# Patient Record
Sex: Female | Born: 1985 | Race: White | Hispanic: No | Marital: Married | Smoking: Never smoker
Health system: Southern US, Community
[De-identification: ages and names within clinical notes are randomized; demographics above are authoritative.]

## PROBLEM LIST (undated history)

## (undated) DIAGNOSIS — R41840 Attention and concentration deficit: Secondary | ICD-10-CM

## (undated) DIAGNOSIS — F419 Anxiety disorder, unspecified: Secondary | ICD-10-CM

## (undated) HISTORY — DX: Anxiety disorder, unspecified: F41.9

## (undated) HISTORY — PX: TONSILLECTOMY: SHX5217

---
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
## 2006-02-16 ENCOUNTER — Emergency Department: Payer: Self-pay | Admitting: Emergency Medicine

## 2007-03-24 ENCOUNTER — Inpatient Hospital Stay (HOSPITAL_COMMUNITY): Admission: AD | Admit: 2007-03-24 | Discharge: 2007-03-24 | Payer: Self-pay | Admitting: Obstetrics and Gynecology

## 2007-04-03 ENCOUNTER — Inpatient Hospital Stay (HOSPITAL_COMMUNITY): Admission: AD | Admit: 2007-04-03 | Discharge: 2007-04-03 | Payer: Self-pay | Admitting: Obstetrics & Gynecology

## 2007-04-04 ENCOUNTER — Inpatient Hospital Stay (HOSPITAL_COMMUNITY): Admission: AD | Admit: 2007-04-04 | Discharge: 2007-04-04 | Payer: Self-pay | Admitting: Obstetrics & Gynecology

## 2007-04-21 ENCOUNTER — Inpatient Hospital Stay (HOSPITAL_COMMUNITY): Admission: RE | Admit: 2007-04-21 | Discharge: 2007-04-23 | Payer: Self-pay | Admitting: Obstetrics and Gynecology

## 2009-12-15 ENCOUNTER — Inpatient Hospital Stay: Payer: Self-pay | Admitting: Psychiatry

## 2011-01-04 NOTE — Op Note (Signed)
[facility]  Patient:    Claudia Casey, Claudia Casey Visit Number: [id number] MRN: [id number]          Service Type: DSU Location: [facility] Attending Physician:  Corie Chiquito Dictated by:   Margit Banda. Jearld Fenton, M.D. Proc. Date: 11/19/01 Admit Date:  11/19/2001 Discharge Date: 11/20/2001   CC:         [facility]   Operative Report  PREOPERATIVE DIAGNOSIS:  Chronic tonsillitis.  POSTOPERATIVE DIAGNOSIS:  Chronic tonsillitis.  PROCEDURE:  Tonsillectomy.  ANESTHESIA:  General endotracheal tube.  ESTIMATED BLOOD LOSS:  Less than 5 cc.  INDICATION:  This is a 25 year old who has had chronic problems with her tonsils.  She has had repetitive infections that have caused significant missed school and discomfort from the issues.  She meets the medical criteria and is now ready to proceed with tonsillectomy.  She was informed of the risks and benefits of the procedure, including bleeding, infection, velopharyngeal insufficiency, change in the voice, chronic pain, and risk of the anesthetic. All questions are answered and consent was obtained.  DESCRIPTION OF PROCEDURE:  Patient taken to the operating room, placed in a supine position.  After adequate general endotracheal anesthesia, was placed in the Rose position, draped in the usual sterile manner.  A Crowe-Davis mouth gag was inserted, retracted, and suspended from the Mayo stand.  The Hurd elevator was used to examine the adenoid tissue, which she had no significant adenoid tissue present.  The left tonsil was begun, making a left anterior tonsillar pillar incision, identifying the capsule of the tonsil, and removing it with electrocautery dissection.  Right tonsil removed in the same fashion. The hemostasis was then achieved with suction cautery.  The Crowe-Davis was released and resuspended, and there was hemostasis present in all locations. The hypopharynx, esophagus, and stomach  were suctioned with the NG tube.  The tonsils both had cryptic debris in both sides that was seen with the tonsils as they were removed.  The patient was brought to the recovery room in stable condition, counts correct. Dictated by:   Margit Banda. Jearld Fenton, M.D. Attending Physician:  Corie Chiquito DD:  11/19/01 TD:  11/19/01 Job: [id number]

## 2011-05-31 LAB — CBC
HCT: 31.9 — ABNORMAL LOW
Hemoglobin: 10.3 — ABNORMAL LOW
Platelets: 231
RBC: 3.55 — ABNORMAL LOW
RDW: 16.1 — ABNORMAL HIGH
WBC: 10.8 — ABNORMAL HIGH
WBC: 11.1 — ABNORMAL HIGH

## 2011-06-03 LAB — URINALYSIS, ROUTINE W REFLEX MICROSCOPIC
Glucose, UA: NEGATIVE
Hgb urine dipstick: NEGATIVE
Specific Gravity, Urine: 1.01
pH: 6.5

## 2012-03-23 ENCOUNTER — Emergency Department: Payer: Self-pay | Admitting: Emergency Medicine

## 2012-03-23 LAB — CBC
HGB: 15.6 g/dL (ref 12.0–16.0)
MCH: 30.6 pg (ref 26.0–34.0)
MCHC: 34.3 g/dL (ref 32.0–36.0)
MCV: 89 fL (ref 80–100)
RBC: 5.08 10*6/uL (ref 3.80–5.20)

## 2012-03-23 LAB — COMPREHENSIVE METABOLIC PANEL
Albumin: 4.1 g/dL (ref 3.4–5.0)
Alkaline Phosphatase: 58 U/L (ref 50–136)
BUN: 12 mg/dL (ref 7–18)
Creatinine: 0.66 mg/dL (ref 0.60–1.30)
EGFR (Non-African Amer.): >60
Glucose: 88 mg/dL (ref 65–99)
Potassium: 4.1 mmol/L (ref 3.5–5.1)
Sodium: 139 mmol/L (ref 136–145)
Total Protein: 7.7 g/dL (ref 6.4–8.2)

## 2012-03-23 LAB — URINALYSIS, COMPLETE
Bacteria: NONE SEEN
Glucose,UR: NEGATIVE mg/dL (ref 0–75)
Ketone: NEGATIVE
Nitrite: NEGATIVE
Protein: NEGATIVE
RBC,UR: 1 /HPF (ref 0–5)
Specific Gravity: 1.01 (ref 1.003–1.030)
WBC UR: <1 /HPF (ref 0–5)

## 2012-03-24 ENCOUNTER — Emergency Department: Payer: Self-pay | Admitting: Emergency Medicine

## 2012-03-24 LAB — COMPREHENSIVE METABOLIC PANEL
Albumin: 4.2 g/dL (ref 3.4–5.0)
Alkaline Phosphatase: 65 U/L (ref 50–136)
Calcium, Total: 9.2 mg/dL (ref 8.5–10.1)
Glucose: 82 mg/dL (ref 65–99)
Osmolality: 274 (ref 275–301)
Sodium: 137 mmol/L (ref 136–145)

## 2012-03-24 LAB — CBC WITH DIFFERENTIAL/PLATELET
Basophil #: 0 10*3/uL (ref 0.0–0.1)
Basophil %: 0.2 %
Eosinophil #: 0.4 10*3/uL (ref 0.0–0.7)
Eosinophil %: 4.3 %
HGB: 15.6 g/dL (ref 12.0–16.0)
Lymphocyte %: 32.2 %
Monocyte %: 7.4 %
Neutrophil %: 55.9 %
RBC: 4.97 10*6/uL (ref 3.80–5.20)
WBC: 8.5 10*3/uL (ref 3.6–11.0)

## 2013-02-02 ENCOUNTER — Emergency Department: Payer: Self-pay | Admitting: Emergency Medicine

## 2013-10-22 ENCOUNTER — Encounter: Payer: Self-pay | Admitting: Family Medicine

## 2013-10-22 DIAGNOSIS — F419 Anxiety disorder, unspecified: Secondary | ICD-10-CM | POA: Insufficient documentation

## 2015-07-03 LAB — OB RESULTS CONSOLE ABO/RH: RH TYPE: POSITIVE

## 2015-08-20 NOTE — L&D Delivery Note (Signed)
Delivery Note At 10:41 PM a viable female was delivered via Vaginal, Spontaneous Delivery (Presentation: Left Occiput Anterior).  APGAR: 7, 9; weight 7 lb 15 oz (3600 g).   Placenta status: Intact, Spontaneous.  Cord: 3 vessels with the following complications: None.  Cord pH: N/A  Anesthesia: Epidural  Episiotomy: None Lacerations: 2nd degree Suture Repair: 3.0 vicryl Est. Blood Loss (mL): 300  Quick second stage. Infant to maternal abdomen for delayed cord clamping, cut by FOB after pulsation stopped. Infant to skin-to-skin with mother then to nursery for further assessment of grunting.   Baby's Name: Claudia Casey  Mom to postpartum.  Baby to Nursery.  Claudia Casey, Claudia Casey 01/31/2016, 11:11 PM

## 2016-01-31 ENCOUNTER — Inpatient Hospital Stay: Payer: Medicaid Other | Admitting: Anesthesiology

## 2016-01-31 ENCOUNTER — Inpatient Hospital Stay
Admission: EM | Admit: 2016-01-31 | Discharge: 2016-02-02 | DRG: 767 | Disposition: A | Discharge status: Home / Self Care | Payer: Medicaid Other | Attending: Advanced Practice Midwife | Admitting: Advanced Practice Midwife

## 2016-01-31 ENCOUNTER — Encounter: Payer: Self-pay | Admitting: *Deleted

## 2016-01-31 DIAGNOSIS — O99824 Streptococcus B carrier state complicating childbirth: Secondary | ICD-10-CM | POA: Diagnosis present

## 2016-01-31 DIAGNOSIS — Z302 Encounter for sterilization: Secondary | ICD-10-CM

## 2016-01-31 DIAGNOSIS — W108XXA Fall (on) (from) other stairs and steps, initial encounter: Secondary | ICD-10-CM | POA: Diagnosis present

## 2016-01-31 DIAGNOSIS — O99820 Streptococcus B carrier state complicating pregnancy: Secondary | ICD-10-CM | POA: Diagnosis present

## 2016-01-31 DIAGNOSIS — Z3A39 39 weeks gestation of pregnancy: Secondary | ICD-10-CM | POA: Diagnosis not present

## 2016-01-31 LAB — CBC
HEMATOCRIT: 35.1 % (ref 35.0–47.0)
HEMOGLOBIN: 11.7 g/dL — AB (ref 12.0–16.0)
MCH: 29 pg (ref 26.0–34.0)
MCHC: 33.4 g/dL (ref 32.0–36.0)
MCV: 86.8 fL (ref 80.0–100.0)
Platelets: 281 10*3/uL (ref 150–440)
RBC: 4.04 MIL/uL (ref 3.80–5.20)
RDW: 15.4 % — ABNORMAL HIGH (ref 11.5–14.5)
WBC: 12.7 10*3/uL — ABNORMAL HIGH (ref 3.6–11.0)

## 2016-01-31 LAB — TYPE AND SCREEN
ABO/RH(D): AB POS
Antibody Screen: NEGATIVE

## 2016-01-31 MED ORDER — LIDOCAINE HCL (PF) 1 % IJ SOLN
INTRAMUSCULAR | Status: AC
  Filled 2016-01-31: qty 30

## 2016-01-31 MED ORDER — SODIUM CHLORIDE 0.9 % IV SOLN
1.0000 g | INTRAVENOUS | Status: DC
  Administered 2016-01-31: 1 g via INTRAVENOUS
  Filled 2016-01-31 (×4): qty 1000

## 2016-01-31 MED ORDER — OXYTOCIN 10 UNIT/ML IJ SOLN
INTRAMUSCULAR | Status: AC
  Filled 2016-01-31: qty 2

## 2016-01-31 MED ORDER — MISOPROSTOL 200 MCG PO TABS
ORAL_TABLET | ORAL | Status: AC
  Filled 2016-01-31: qty 4

## 2016-01-31 MED ORDER — AMPICILLIN SODIUM 2 G IJ SOLR
2.0000 g | Freq: Once | INTRAMUSCULAR | Status: AC
  Administered 2016-01-31: 2 g via INTRAVENOUS
  Filled 2016-01-31: qty 2000

## 2016-01-31 MED ORDER — ACETAMINOPHEN 325 MG PO TABS: 650.0000 mg | ORAL_TABLET | ORAL | Status: DC | PRN

## 2016-01-31 MED ORDER — LACTATED RINGERS IV SOLN
500.0000 mL | INTRAVENOUS | Status: DC | PRN
  Administered 2016-01-31: 500 mL via INTRAVENOUS

## 2016-01-31 MED ORDER — OXYTOCIN 40 UNITS IN LACTATED RINGERS INFUSION - SIMPLE MED
2.5000 [IU]/h | INTRAVENOUS | Status: DC
  Filled 2016-01-31: qty 1000

## 2016-01-31 MED ORDER — OXYTOCIN BOLUS FROM INFUSION: 500.0000 mL | INTRAVENOUS | Status: DC

## 2016-01-31 MED ORDER — FENTANYL 2.5 MCG/ML W/ROPIVACAINE 0.2% IN NS 100 ML EPIDURAL INFUSION (ARMC-ANES)
EPIDURAL | Status: AC
  Filled 2016-01-31: qty 100

## 2016-01-31 MED ORDER — AMMONIA AROMATIC IN INHA
RESPIRATORY_TRACT | Status: AC
  Filled 2016-01-31: qty 10

## 2016-01-31 MED ORDER — LACTATED RINGERS IV SOLN
INTRAVENOUS | Status: DC
  Administered 2016-01-31: 15:00:00 via INTRAVENOUS

## 2016-01-31 MED ORDER — BUTORPHANOL TARTRATE 1 MG/ML IJ SOLN
2.0000 mg | INTRAMUSCULAR | Status: DC | PRN
  Administered 2016-01-31: 1 mg via INTRAVENOUS
  Filled 2016-01-31: qty 2

## 2016-01-31 MED ORDER — CALCIUM CARBONATE ANTACID 500 MG PO CHEW: 2.0000 | CHEWABLE_TABLET | ORAL | Status: DC | PRN

## 2016-01-31 NOTE — OB Triage Note (Signed)
G3P2 presents at 497w5d after falling at home. No bleeding, LOF. +FM

## 2016-01-31 NOTE — Plan of Care (Signed)
Pt sitting up for epidural

## 2016-01-31 NOTE — Discharge Instructions (Signed)
Discharge instructions:  ° °Call office if you have any of the following: headache, visual changes, fever >100 F, chills, breast concerns, excessive vaginal bleeding, incision drainage or problems, leg pain or redness, depression or any other concerns.  ° °Activity: Do not lift > 10 lbs for 6 weeks.  °No intercourse or tampons for 6 weeks.  °No driving for 1-2 weeks.  ° °

## 2016-01-31 NOTE — Discharge Summary (Signed)
Obstetric Discharge Summary Reason for Admission: onset of labor Delivery Type: spontaneous vaginal delivery Postpartum Procedures: P.P. tubal ligation Complications-Intrapartum or Postpartum: none    Recent Labs  01/31/16 1453 02/01/16 0554  HGB 11.7* 10.3*  HCT 35.1 30.7*      Gestational Age at Delivery: 6256w5d  Antepartum complications: none Date of Delivery: 01/31/16  Delivered By: T. Brothers, CNM   Physical Exam:  General: alert, cooperative and appears stated age 51Lochia: appropriate Uterine Fundus: firm Incision: healing well DVT Evaluation: No evidence of DVT seen on physical exam. Abdomen: abdomen is soft without significant tenderness, masses, organomegaly or guarding  Prenatal Labs Blood Type: AB+ Rubella: Immune Varicella: Immune TDAP: Up to date and Given during pregnancy Flu: N/A, Given during pregnancy, Given postpartum and Declined by patient Feeding: Breast and bottle Contraception: BTL   Discharge Diagnoses: Term Pregnancy-delivered  Discharge Information: Date: 02/02/2016 Activity: pelvic rest Diet: routine Medications: PNV and Percocet Condition: stable Instructions:  Discharge instructions:   Call office if you have any of the following: headache, visual changes, fever >100 F, chills, breast concerns, excessive vaginal bleeding, incision drainage or problems, leg pain or redness, depression or any other concerns.   Activity: Do not lift > 10 lbs for 6 weeks.  No intercourse or tampons for 6 weeks.  No driving for 1-2 weeks.   Discharge to: home Follow-up Information    Follow up with Marta AntuBrothers, Tamara, CNM. Schedule an appointment as soon as possible for a visit in 6 weeks.   Specialty:  Certified Nurse Midwife   Why:  postpartum visit   Contact information:   [address] [place] [address] [phone]       Newborn Data: Live born female  Birth Weight: 7 lb 15 oz (3600 g) APGAR: 7, 9  Home with mother     Claudia Casey,Claudia Casey, CNM

## 2016-01-31 NOTE — Progress Notes (Signed)
Fetal heart down from 120 to 90-95 x 2 minutes. Cervix remains 8 . Pt repositioned. Fluid bolus given

## 2016-01-31 NOTE — H&P (Signed)
Obstetric History and Physical  Claudia Casey is a 30 y.o. G3P2 with Estimated Date of Delivery: 02/02/16 per 8 wk US who presents at 6481w5d  presenting for prolonged fetal monitoring after falling down the stairs, landing on her knees this am. Patient states she has been having mild contractions, no vaginal bleeding, intact membranes, with active fetal movement.    Prenatal Course Source of Care: [facility]  with onset of care at 8 weeks Pregnancy complications or risks: Patient Active Problem List   Diagnosis Date Noted  . Fall (on) (from) other stairs and steps, initial encounter 01/31/2016  . Anxiety    She plans to breastfeed, plans to bottle feed She desires bilateral tubal ligation for postpartum contraception.   Prenatal labs and studies: ABO, Rh: AB+  Antibody: neg Rubella: Immune Varicella: Immune RPR:  NR HBsAg:  Neg HIV: Neg GC/CT: Neg/Neg GBS: positive 1 hr Glucola: 112 and normal early glucola   Genetic screening: declined  TDAP: given in pregnancy   Prenatal Transfer Tool   Past Medical History  Diagnosis Date  . Anxiety     occasional    Past Surgical History  Procedure Laterality Date  . Tonsillectomy      30 yo    OB History  Gravida Para Term Preterm AB SAB TAB Ectopic Multiple Living  3 2        2     # Outcome Date GA Lbr Len/2nd Weight Sex Delivery Anes PTL Lv  3 Current           2 Para 04/21/07    M Vag-Spont EPI N Y  1 Para 11/21/03    F Vag-Spont EPI N Y      Social History   Social History  . Marital Status: Legally Separated    Spouse Name: N/A  . Number of Children: N/A  . Years of Education: N/A   Social History Main Topics  . Smoking status: Never Smoker   . Smokeless tobacco: Never Used  . Alcohol Use: No  . Drug Use: No  . Sexual Activity: Yes    Birth Control/ Protection: IUD   Other Topics Concern  . None   Social History Narrative    History reviewed. No pertinent family history.  Prescriptions prior to  admission  Medication Sig Dispense Refill Last Dose  . Prenatal Vit-Fe Fumarate-FA (PRENATAL MULTIVITAMIN) TABS tablet Take 1 tablet by mouth daily at 12 noon.   01/30/2016 at Unknown time    No Known Allergies  Review of Systems: Negative except for what is mentioned in HPI.  Physical Exam: BP 123/72 mmHg  Pulse 81  Temp(Src) 98.1 F (36.7 C) (Oral)  Resp 20 GENERAL: Well-developed, well-nourished female in no acute distress.  ABDOMEN: Soft, tender w/ctx, nondistended, gravid. Cervical Exam: Dilatation 4.5cm   Effacement 70%   Station -1  (3.5/70/-1 at office about 2 hours earlier) Presentation: cephalic FHT: Category: 1 Baseline rate 130 bpm   Variability moderate  Accelerations present   Decelerations: quick variables Contractions: Every 2-4 mins   Pertinent Labs/Studies:   No results found for this or any previous visit (from the past 24 hour(s)).  Assessment : IUP at 3881w5d, s/p fall with reassuring fetal tracing, labor  Plan: Admit for labor, Antibiotics for GBS prophylaxis and Observe for cervical change

## 2016-01-31 NOTE — Anesthesia Preprocedure Evaluation (Signed)
Anesthesia Evaluation  Patient identified by MRN, date of birth, ID band Patient awake    Reviewed: Allergy & Precautions, NPO status , Patient's Chart, lab work & pertinent test results, reviewed documented beta blocker date and time   Airway Mallampati: II  TM Distance: >3 FB     Dental  (+) Chipped   Pulmonary           Cardiovascular      Neuro/Psych Anxiety    GI/Hepatic   Endo/Other    Renal/GU      Musculoskeletal   Abdominal   Peds  Hematology   Anesthesia Other Findings   Reproductive/Obstetrics                             Anesthesia Physical Anesthesia Plan  ASA: II  Anesthesia Plan: Epidural   Post-op Pain Management:    Induction:   Airway Management Planned:   Additional Equipment:   Intra-op Plan:   Post-operative Plan:   Informed Consent: I have reviewed the patients History and Physical, chart, labs and discussed the procedure including the risks, benefits and alternatives for the proposed anesthesia with the patient or authorized representative who has indicated his/her understanding and acceptance.     Plan Discussed with: CRNA  Anesthesia Plan Comments:         Anesthesia Quick Evaluation

## 2016-01-31 NOTE — Plan of Care (Signed)
Infant taken to nuirsery by s [employer]matthews,rn

## 2016-01-31 NOTE — Progress Notes (Signed)
L&D Note  01/31/2016 - 7:46 PM  30 y.o. G3P2 8572w5d   Ms. Colon Branchabitha S Truby is admitted for labor   Subjective:  Comfortable after epidural  Objective:   Filed Vitals:   01/31/16 1759 01/31/16 1816 01/31/16 1826 01/31/16 1926  BP: 100/43 110/67 114/55 103/51  Pulse: 79 97 99 89  Temp:  98.8 F (37.1 C)    TempSrc:      Resp:      Height:      Weight:        Current Vital Signs 24h Vital Sign Ranges  T 98.8 F (37.1 C) Temp  Avg: 98.3 F (36.8 C)  Min: 98.1 F (36.7 C)  Max: 98.8 F (37.1 C)  BP (!) 103/51 mmHg BP  Min: 98/49  Max: 126/74  HR 89 Pulse  Avg: 88.4  Min: 77  Max: 108  RR 20 Resp  Avg: 20  Min: 20  Max: 20  SaO2     No Data Recorded       24 Hour I/O Current Shift I/O  Time Ins Outs        FHR: baseline 115-120, mod variability, + accels, no decels Toco: q 2-4 min SVE: 8/100/-1   Assessment :  IUP at 6072w5d, labor    Plan:  AROM with clear fluid noted Anticipate vaginal delivery  Marta AntuBrothers, Lui Bellis, [place]

## 2016-02-01 ENCOUNTER — Inpatient Hospital Stay: Payer: Medicaid Other | Admitting: Anesthesiology

## 2016-02-01 ENCOUNTER — Encounter
Admission: EM | Disposition: A | Discharge status: Home / Self Care | Payer: Self-pay | Source: Home / Self Care | Attending: Advanced Practice Midwife

## 2016-02-01 ENCOUNTER — Encounter: Payer: Self-pay | Admitting: Anesthesiology

## 2016-02-01 HISTORY — PX: TUBAL LIGATION: SHX77

## 2016-02-01 LAB — CBC
HCT: 30.7 % — ABNORMAL LOW (ref 35.0–47.0)
HEMOGLOBIN: 10.3 g/dL — AB (ref 12.0–16.0)
MCH: 28.8 pg (ref 26.0–34.0)
MCHC: 33.4 g/dL (ref 32.0–36.0)
MCV: 86.5 fL (ref 80.0–100.0)
PLATELETS: 248 10*3/uL (ref 150–440)
RBC: 3.56 MIL/uL — ABNORMAL LOW (ref 3.80–5.20)
RDW: 15.1 % — AB (ref 11.5–14.5)
WBC: 16.9 10*3/uL — ABNORMAL HIGH (ref 3.6–11.0)

## 2016-02-01 LAB — RPR: RPR: NONREACTIVE

## 2016-02-01 SURGERY — LIGATION, FALLOPIAN TUBE, POSTPARTUM
Anesthesia: General | Laterality: Bilateral | Wound class: Clean Contaminated

## 2016-02-01 MED ORDER — WITCH HAZEL-GLYCERIN EX PADS: 1.0000 "application " | MEDICATED_PAD | CUTANEOUS | Status: DC | PRN

## 2016-02-01 MED ORDER — DEXAMETHASONE SODIUM PHOSPHATE 10 MG/ML IJ SOLN
INTRAMUSCULAR | Status: DC | PRN
  Administered 2016-02-01: 10 mg via INTRAVENOUS

## 2016-02-01 MED ORDER — LIDOCAINE HCL (CARDIAC) 20 MG/ML IV SOLN
INTRAVENOUS | Status: DC | PRN
  Administered 2016-02-01: 50 mg via INTRAVENOUS

## 2016-02-01 MED ORDER — LACTATED RINGERS IV SOLN
INTRAVENOUS | Status: DC
  Administered 2016-02-01 (×2): via INTRAVENOUS

## 2016-02-01 MED ORDER — OXYCODONE-ACETAMINOPHEN 5-325 MG PO TABS: 2.0000 | ORAL_TABLET | ORAL | Status: DC | PRN

## 2016-02-01 MED ORDER — ONDANSETRON HCL 4 MG/2ML IJ SOLN
4.0000 mg | Freq: Four times a day (QID) | INTRAMUSCULAR | Status: DC | PRN
Start: 2016-02-01 — End: 2016-02-02

## 2016-02-01 MED ORDER — FERROUS SULFATE 325 (65 FE) MG PO TABS
325.0000 mg | ORAL_TABLET | Freq: Every day | ORAL | Status: DC
  Administered 2016-02-01: 325 mg via ORAL
  Filled 2016-02-01: qty 1

## 2016-02-01 MED ORDER — BENZOCAINE-MENTHOL 20-0.5 % EX AERO
1.0000 "application " | INHALATION_SPRAY | CUTANEOUS | Status: DC | PRN
  Filled 2016-02-01: qty 56

## 2016-02-01 MED ORDER — PROPOFOL 10 MG/ML IV BOLUS
INTRAVENOUS | Status: DC | PRN
  Administered 2016-02-01: 170 mg via INTRAVENOUS

## 2016-02-01 MED ORDER — SUCCINYLCHOLINE CHLORIDE 20 MG/ML IJ SOLN
INTRAMUSCULAR | Status: DC | PRN
  Administered 2016-02-01: 100 mg via INTRAVENOUS

## 2016-02-01 MED ORDER — ONDANSETRON HCL 4 MG PO TABS: 4.0000 mg | ORAL_TABLET | ORAL | Status: DC | PRN

## 2016-02-01 MED ORDER — MORPHINE SULFATE (PF) 2 MG/ML IV SOLN
2.0000 mg | Freq: Once | INTRAVENOUS | Status: AC
  Administered 2016-02-01: 2 mg via INTRAVENOUS
  Filled 2016-02-01: qty 1

## 2016-02-01 MED ORDER — FENTANYL CITRATE (PF) 100 MCG/2ML IJ SOLN
25.0000 ug | INTRAMUSCULAR | Status: DC | PRN
  Administered 2016-02-01 (×3): 50 ug via INTRAVENOUS

## 2016-02-01 MED ORDER — FENTANYL CITRATE (PF) 100 MCG/2ML IJ SOLN
INTRAMUSCULAR | Status: AC
  Filled 2016-02-01: qty 2

## 2016-02-01 MED ORDER — ONDANSETRON HCL 4 MG/2ML IJ SOLN: 4.0000 mg | INTRAMUSCULAR | Status: DC | PRN

## 2016-02-01 MED ORDER — PRENATAL MULTIVITAMIN CH
1.0000 | ORAL_TABLET | Freq: Every day | ORAL | Status: DC
  Administered 2016-02-01: 1 via ORAL
  Filled 2016-02-01: qty 1

## 2016-02-01 MED ORDER — OXYCODONE HCL 5 MG/5ML PO SOLN: 5.0000 mg | Freq: Once | ORAL | Status: DC | PRN

## 2016-02-01 MED ORDER — SIMETHICONE 80 MG PO CHEW: 80.0000 mg | CHEWABLE_TABLET | ORAL | Status: DC | PRN

## 2016-02-01 MED ORDER — IBUPROFEN 600 MG PO TABS
600.0000 mg | ORAL_TABLET | Freq: Four times a day (QID) | ORAL | Status: DC | PRN
  Administered 2016-02-01 – 2016-02-02 (×2): 600 mg via ORAL
  Filled 2016-02-01 (×3): qty 1

## 2016-02-01 MED ORDER — OXYCODONE-ACETAMINOPHEN 5-325 MG PO TABS: 1.0000 | ORAL_TABLET | ORAL | Status: DC | PRN

## 2016-02-01 MED ORDER — COCONUT OIL OIL: 1.0000 "application " | TOPICAL_OIL | Status: DC | PRN

## 2016-02-01 MED ORDER — IBUPROFEN 600 MG PO TABS
ORAL_TABLET | ORAL | Status: AC
  Administered 2016-02-01: 600 mg via ORAL
  Filled 2016-02-01: qty 1

## 2016-02-01 MED ORDER — DIPHENHYDRAMINE HCL 25 MG PO CAPS: 25.0000 mg | ORAL_CAPSULE | Freq: Four times a day (QID) | ORAL | Status: DC | PRN

## 2016-02-01 MED ORDER — ACETAMINOPHEN 325 MG PO TABS: 650.0000 mg | ORAL_TABLET | ORAL | Status: DC | PRN

## 2016-02-01 MED ORDER — IBUPROFEN 600 MG PO TABS
600.0000 mg | ORAL_TABLET | Freq: Four times a day (QID) | ORAL | Status: DC
  Administered 2016-02-01 (×3): 600 mg via ORAL
  Filled 2016-02-01 (×2): qty 1

## 2016-02-01 MED ORDER — FENTANYL CITRATE (PF) 100 MCG/2ML IJ SOLN
INTRAMUSCULAR | Status: DC | PRN
  Administered 2016-02-01 (×2): 50 ug via INTRAVENOUS

## 2016-02-01 MED ORDER — DOCUSATE SODIUM 100 MG PO CAPS: 100.0000 mg | ORAL_CAPSULE | Freq: Two times a day (BID) | ORAL | Status: DC | PRN

## 2016-02-01 MED ORDER — BUPIVACAINE HCL 0.5 % IJ SOLN
INTRAMUSCULAR | Status: DC | PRN
  Administered 2016-02-01: 10 mL

## 2016-02-01 MED ORDER — DIBUCAINE 1 % RE OINT: 1.0000 "application " | TOPICAL_OINTMENT | RECTAL | Status: DC | PRN

## 2016-02-01 MED ORDER — OXYCODONE HCL 5 MG PO TABS: 5.0000 mg | ORAL_TABLET | Freq: Once | ORAL | Status: DC | PRN

## 2016-02-01 MED ORDER — OXYCODONE-ACETAMINOPHEN 5-325 MG PO TABS
1.0000 | ORAL_TABLET | ORAL | Status: DC | PRN
  Administered 2016-02-01 (×2): 1 via ORAL
  Administered 2016-02-02: 2 via ORAL
  Filled 2016-02-01: qty 2
  Filled 2016-02-01 (×2): qty 1

## 2016-02-01 MED ORDER — BUPIVACAINE HCL (PF) 0.5 % IJ SOLN
INTRAMUSCULAR | Status: AC
  Filled 2016-02-01: qty 30

## 2016-02-01 MED ORDER — ZOLPIDEM TARTRATE 5 MG PO TABS: 5.0000 mg | ORAL_TABLET | Freq: Every evening | ORAL | Status: DC | PRN

## 2016-02-01 MED ORDER — ONDANSETRON HCL 4 MG/2ML IJ SOLN
INTRAMUSCULAR | Status: DC | PRN
  Administered 2016-02-01: 4 mg via INTRAVENOUS

## 2016-02-01 SURGICAL SUPPLY — 25 items
CHLORAPREP W/TINT 26ML (MISCELLANEOUS) ×3 IMPLANT
DRAPE LAPAROTOMY 100X77 ABD (DRAPES) ×3 IMPLANT
DRESSING TELFA 4X3 1S ST N-ADH (GAUZE/BANDAGES/DRESSINGS) ×3 IMPLANT
DRSG TEGADERM 2-3/8X2-3/4 SM (GAUZE/BANDAGES/DRESSINGS) ×3 IMPLANT
ELECT REM PT RETURN 9FT ADLT (ELECTROSURGICAL) ×3
ELECTRODE REM PT RTRN 9FT ADLT (ELECTROSURGICAL) ×1 IMPLANT
GLOVE BIO SURGEON STRL SZ7 (GLOVE) ×3 IMPLANT
GLOVE INDICATOR 7.5 STRL GRN (GLOVE) ×3 IMPLANT
GOWN STRL REUS W/ TWL LRG LVL3 (GOWN DISPOSABLE) ×2 IMPLANT
GOWN STRL REUS W/TWL LRG LVL3 (GOWN DISPOSABLE) ×6
LABEL OR SOLS (LABEL) ×3 IMPLANT
LIQUID BAND (GAUZE/BANDAGES/DRESSINGS) ×3 IMPLANT
NDL SAFETY 22GX1.5 (NEEDLE) ×3 IMPLANT
NS IRRIG 500ML POUR BTL (IV SOLUTION) ×3 IMPLANT
PACK BASIN MINOR ARMC (MISCELLANEOUS) ×3 IMPLANT
SPONGE LAP 4X18 5PK (MISCELLANEOUS) ×3 IMPLANT
STRAP SAFETY BODY (MISCELLANEOUS) ×3 IMPLANT
SUT CHROMIC GUT BROWN 0 54 (SUTURE) ×1 IMPLANT
SUT CHROMIC GUT BROWN 0 54IN (SUTURE) ×3
SUT MNCRL 4-0 (SUTURE) ×3
SUT MNCRL 4-0 27XMFL (SUTURE) ×1
SUT VIC AB 0 UR5 27 (SUTURE) ×3 IMPLANT
SUT VIC AB 2-0 UR6 27 (SUTURE) ×6 IMPLANT
SUTURE MNCRL 4-0 27XMF (SUTURE) ×1 IMPLANT
SYRINGE 10CC LL (SYRINGE) ×3 IMPLANT

## 2016-02-01 NOTE — Op Note (Signed)
Preoperative Diagnosis: 1) 30 y.o.  [id number] postpartum day 1 2) Desires permanent surgical sterilization  Postoperative Diagnosis: 1) 30 y.o.  [id number] postpartum day 1 2) Desires permanent surgical sterilization  Operation Performed: Postpartum tubal ligation via Pomeroy method  Indication: 30 y.o. G3P1003  with undesired fertility, desires permanent sterilization.  Other reversible forms of contraception were discussed with patient; she declines all other modalities. Permanent nature of as well as associated risks of the procedure discussed with patient including but not limited to: risk of regret, permanence of method, bleeding, infection, injury to surrounding organs and need for additional procedures.  Failure risk of 0.5-1% with increased risk of ectopic gestation if pregnancy occurs was also discussed with patient.    Surgeon: Vena AustriaAndreas Narda Fundora, MD  Anesthesia: General  Preoperative Antibiotics: none  Estimated Blood Loss: minimal  IV Fluids: 500mL  Drains or Tubes: none  Implants: none  Specimens Removed: bilateral portion of fallopian tube  Complications: none  Intraoperative Findings: Normal tubes, ovaries, and uterus.  Good 1cm knuckle of tube removed bilaterally and tubal ostia visualized  Patient Condition: stable  Procedure in Detail:  Patient was taken to the operating room where she was administered general anesthesia.  She was positioned in the supine position, prepped and draped in the usual sterile fashion.  Prior to proceeding with procedure a time out was performed.    Attention was turned to the patient's abdomen.  The umbilicus was infiltrated with 1% Sensorcaine, before making a stab incision using an 11 blade scalpel.  This was extended to 3cm using the scalpel.  The subcutaneous tissue was dissected off the rectus fascia bluntly using a hemostat and [employer] retractors.  The fascia was grasped with a hemostat, tented up and re-grasped with a second  hemostat before releasing and re-grasping with the first hemostat to verify no underlying bowl had been inadvertently grasped. The peritoneum was then entered bluntly using the [employer] retractors.  The table was tilted to the left, a minilap was introduced into the abdomen with a hemostat tag to clear the underlying omentum off the uterus.  The right tube was identified and walked out to its fimbriated end using Singley forceps and a babcock clamp. The mid-isthmic portion was then grasped with the babcock and suture ligated twice using a 2-0 chromic, the intervening knuckle of tube excised using Metzenbaum scissors.  Tubal ostia were clearly visualized and the tube was hemostatic before returning it to the abdomen.  The mini-lap was removed the patient was tilted to the right and the same procedure was repeated on the left.  The right tube was identified and grasped in a mid-isthmic portion using the falope ring application.  The falope ring was applied with a good 1cm knuckle of blanching tube noted within the falope ring after application.  The left tube was identified and a second falope ring was applied in a similar fashion.Fascia was closed using a 2-0 Vicryl, skin closed using 4-0 monocryl in a subcuticular fashion.  Sponge needle and instrument counts were correct time two.  The patient tolerated the procedure well and was taken to the recovery room in stable condition.

## 2016-02-01 NOTE — Anesthesia Preprocedure Evaluation (Signed)
Anesthesia Evaluation  Patient identified by MRN, date of birth, ID band Patient awake    Reviewed: Allergy & Precautions, H&P , NPO status , Patient's Chart, lab work & pertinent test results  History of Anesthesia Complications Negative for: history of anesthetic complications  Airway Mallampati: III  TM Distance: >3 FB Neck ROM: full    Dental  (+) Poor Dentition, Chipped   Pulmonary neg pulmonary ROS, neg shortness of breath,    Pulmonary exam normal breath sounds clear to auscultation       Cardiovascular Exercise Tolerance: Good (-) angina(-) Past MI and (-) DOE negative cardio ROS Normal cardiovascular exam Rhythm:regular Rate:Normal     Neuro/Psych PSYCHIATRIC DISORDERS Anxiety negative neurological ROS     GI/Hepatic negative GI ROS, Neg liver ROS, neg GERD  ,  Endo/Other  negative endocrine ROS  Renal/GU negative Renal ROS  negative genitourinary   Musculoskeletal   Abdominal   Peds  Hematology negative hematology ROS (+)   Anesthesia Other Findings Post partum  Past Medical History:   Anxiety                                                        Comment:occasional  Past Surgical History:   TONSILLECTOMY                                                   Comment:30 yo  BMI    Body Mass Index   36.20 kg/m 2      Reproductive/Obstetrics                             Anesthesia Physical Anesthesia Plan  ASA: II  Anesthesia Plan: General ETT   Post-op Pain Management:    Induction:   Airway Management Planned:   Additional Equipment:   Intra-op Plan:   Post-operative Plan:   Informed Consent: I have reviewed the patients History and Physical, chart, labs and discussed the procedure including the risks, benefits and alternatives for the proposed anesthesia with the patient or authorized representative who has indicated his/her understanding and acceptance.    Dental Advisory Given  Plan Discussed with: Anesthesiologist, CRNA and Surgeon  Anesthesia Plan Comments:         Anesthesia Quick Evaluation

## 2016-02-01 NOTE — Anesthesia Procedure Notes (Signed)
Procedure Name: Intubation Performed by: Delylah Stanczyk Pre-anesthesia Checklist: Patient identified, Patient being monitored, Timeout performed, Emergency Drugs available and Suction available Patient Re-evaluated:Patient Re-evaluated prior to inductionOxygen Delivery Method: Circle system utilized Preoxygenation: Pre-oxygenation with 100% oxygen Intubation Type: IV induction and Rapid sequence Laryngoscope Size: Miller and 2 Grade View: Grade I Tube type: Oral Tube size: 7.0 mm Number of attempts: 1 Airway Equipment and Method: Stylet Placement Confirmation: ETT inserted through vocal cords under direct vision,  positive ETCO2 and breath sounds checked- equal and bilateral Secured at: 20 cm Tube secured with: Tape Dental Injury: Teeth and Oropharynx as per pre-operative assessment        

## 2016-02-01 NOTE — Transfer of Care (Signed)
Immediate Anesthesia Transfer of Care Note  Patient: Colon Branchabitha S Zayed  Procedure(s) Performed: Procedure(s): POST PARTUM TUBAL LIGATION (Bilateral)  Patient Location: PACU  Anesthesia Type:General  Level of Consciousness: awake  Airway & Oxygen Therapy: Patient Spontanous Breathing and Patient connected to face mask oxygen  Post-op Assessment: Report given to RN and Post -op Vital signs reviewed and stable  Post vital signs: Reviewed  Last Vitals:  Filed Vitals:   02/01/16 1721 02/01/16 1845  BP: 116/58 113/81  Pulse: 80 72  Temp: 36.8 C 36.3 C  Resp: 18 14    Last Pain:  Filed Vitals:   02/01/16 1846  PainSc: 0-No pain         Complications: No apparent anesthesia complications

## 2016-02-01 NOTE — Progress Notes (Signed)
Post Partum Day1 Subjective: no complaints, up ad lib and voiding/ awaiting surgery this Am for BTL. Has been NPO  Objective: Blood pressure 104/63, pulse 84, temperature 98.1 F (36.7 C), temperature source Oral, resp. rate 20, height 5\' 2"  (1.575 m), weight 89.812 kg (198 lb), SpO2 98 %, unknown if currently breastfeeding.  Physical Exam:  General: alert, cooperative and no distress Lochia: appropriate Uterine Fundus: firm/ U-1/ slightly deviated to right Perineum healing well, no significant erythema DVT Evaluation: No evidence of DVT seen on physical exam.   Recent Labs  01/31/16 1453 02/01/16 0554  HGB 11.7* 10.3*  HCT 35.1 30.7*  WBC 12.7* 16.9*  PLT 281 248    Assessment/Plan: stable PPD #1 Preop for BTL this Am.  Plan for discharge tomorrow  Breast AB+/RI/ VI/ TDAP UTD   LOS: 1 day   Lynette Topete 02/01/2016, 9:59 AM

## 2016-02-01 NOTE — Anesthesia Postprocedure Evaluation (Signed)
Anesthesia Post Note  Patient: Claudia Casey  Procedure(s) Performed: Procedure(s) (LRB): POST PARTUM TUBAL LIGATION (Bilateral)  Patient location during evaluation: PACU Anesthesia Type: General Level of consciousness: awake and alert Pain management: pain level controlled Vital Signs Assessment: post-procedure vital signs reviewed and stable Respiratory status: spontaneous breathing, nonlabored ventilation, respiratory function stable and patient connected to nasal cannula oxygen Cardiovascular status: blood pressure returned to baseline and stable Postop Assessment: no signs of nausea or vomiting Anesthetic complications: no    Last Vitals:  Filed Vitals:   02/01/16 1945 02/01/16 2035  BP: 104/71 118/74  Pulse: 74 74  Temp: 36.3 C 37.1 C  Resp: 20 18    Last Pain:  Filed Vitals:   02/01/16 2035  PainSc: Asleep                 Cleda MccreedyJoseph K Kindred Reidinger

## 2016-02-01 NOTE — Progress Notes (Signed)
Date of Initial H&P: 01/31/16  History reviewed, patient examined, no change in status, stable for surgery.

## 2016-02-02 ENCOUNTER — Encounter: Payer: Self-pay | Admitting: Obstetrics and Gynecology

## 2016-02-02 MED ORDER — FENTANYL 2.5 MCG/ML W/ROPIVACAINE 0.2% IN NS 100 ML EPIDURAL INFUSION (ARMC-ANES)
EPIDURAL | Status: DC | PRN
  Administered 2016-01-31: 10 mL/h via EPIDURAL

## 2016-02-02 MED ORDER — OXYCODONE-ACETAMINOPHEN 5-325 MG PO TABS: 1.0000 | ORAL_TABLET | ORAL | Status: DC | PRN

## 2016-02-02 NOTE — Anesthesia Post-op Follow-up Note (Deleted)
  Anesthesia Pain Follow-up Note  Patient: Claudia Casey  Day #: 2  Date of Follow-up: 02/02/2016 Time: 7:13 AM  Last Vitals:  Filed Vitals:   02/01/16 2259 02/02/16 0426  BP: 110/64 104/63  Pulse: 84 70  Temp: 36.8 C 36.4 C  Resp: 18 16    Level of Consciousness: alert  Pain: none   Side Effects:None  Catheter Site Exam:site not evaluated  Plan: D/C from anesthesia care  Clydene PughBeane, Severo Beber D

## 2016-02-02 NOTE — Anesthesia Postprocedure Evaluation (Deleted)
Anesthesia Post Note  Patient: Claudia Casey  Procedure(s) Performed: * No procedures listed *  Patient location during evaluation: Mother Baby Anesthesia Type: Epidural Level of consciousness: awake and alert and oriented Pain management: satisfactory to patient Vital Signs Assessment: post-procedure vital signs reviewed and stable Respiratory status: respiratory function stable Cardiovascular status: stable Postop Assessment: no headache, no backache, epidural receding, patient able to bend at knees, no signs of nausea or vomiting and adequate PO intake Anesthetic complications: no    Last Vitals:  Filed Vitals:   02/01/16 2259 02/02/16 0426  BP: 110/64 104/63  Pulse: 84 70  Temp: 36.8 C 36.4 C  Resp: 18 16    Last Pain:  Filed Vitals:   02/02/16 0506  PainSc: 1                  Clydene PughBeane, Coriana Angello D

## 2016-02-02 NOTE — Progress Notes (Signed)
Pt discharged home with infant.  Discharge instructions and follow up appointment given to and reviewed with pt.  Pt verbalized understanding.  Escorted by auxillary. 

## 2016-02-02 NOTE — Anesthesia Procedure Notes (Signed)
Epidural Patient location during procedure: OB  Staffing Anesthesiologist: Keyonta Barradas Performed by: anesthesiologist   Preanesthetic Checklist Completed: patient identified, site marked, surgical consent, pre-op evaluation, timeout performed, IV checked, risks and benefits discussed and monitors and equipment checked  Epidural Patient position: sitting Prep: Betadine Patient monitoring: heart rate, continuous pulse ox and blood pressure Approach: midline Location: L4-L5 Injection technique: LOR saline  Needle:  Needle type: Tuohy  Needle gauge: 18 G Needle length: 9 cm and 9 Catheter type: closed end flexible Catheter size: 20 Guage Test dose: negative and 1.5% lidocaine with Epi 1:200 K  Assessment Sensory level: T10 Events: blood not aspirated, injection not painful, no injection resistance, negative IV test and no paresthesia  Additional Notes   Patient tolerated the insertion well without complications.Reason for block:procedure for pain   

## 2016-02-05 LAB — SURGICAL PATHOLOGY

## 2016-02-06 NOTE — Anesthesia Postprocedure Evaluation (Signed)
Anesthesia Post Note  Patient: Claudia Casey  Procedure(s) Performed: * No procedures listed *  Patient location during evaluation: Other Anesthesia Type: General Level of consciousness: awake and alert Pain management: pain level controlled Vital Signs Assessment: post-procedure vital signs reviewed and stable Respiratory status: spontaneous breathing, nonlabored ventilation, respiratory function stable and patient connected to nasal cannula oxygen Cardiovascular status: blood pressure returned to baseline and stable Postop Assessment: no signs of nausea or vomiting Anesthetic complications: no    Last Vitals: There were no vitals filed for this visit.  Last Pain: There were no vitals filed for this visit.               Michah Minton S

## 2016-12-18 ENCOUNTER — Telehealth: Payer: Self-pay

## 2016-12-18 NOTE — Telephone Encounter (Signed)
Please advise 

## 2016-12-18 NOTE — Telephone Encounter (Signed)
They were tied and she had a segment on each side excised

## 2016-12-18 NOTE — Telephone Encounter (Signed)
Pt calling wanting to know if her tubes were burnt, tied, or clamped when her tubal was done 01/2016.  [phone].

## 2016-12-19 NOTE — Telephone Encounter (Signed)
Pt aware. KJ CMA 

## 2017-04-22 ENCOUNTER — Encounter: Payer: Self-pay | Admitting: Advanced Practice Midwife

## 2017-04-22 ENCOUNTER — Ambulatory Visit (INDEPENDENT_AMBULATORY_CARE_PROVIDER_SITE_OTHER): Payer: BLUE CROSS/BLUE SHIELD | Admitting: Advanced Practice Midwife

## 2017-04-22 VITALS — BP 118/74 | Ht 62.0 in | Wt 190.0 lb

## 2017-04-22 DIAGNOSIS — Z124 Encounter for screening for malignant neoplasm of cervix: Secondary | ICD-10-CM

## 2017-04-22 DIAGNOSIS — Z01419 Encounter for gynecological examination (general) (routine) without abnormal findings: Secondary | ICD-10-CM

## 2017-04-22 NOTE — Progress Notes (Signed)
Patient ID: Claudia Casey, female   DOB: 11/29/1985, 31 y.o.   MRN: [id number]     Gynecology Annual Exam  PCP: Dorena Bodoixon, Mary B, PA-C  Chief Complaint:  Chief Complaint  Patient presents with  . Annual Exam    History of Present Illness: Patient is a 31 y.o. G3P1003 presents for annual exam. The patient has no complaints today.   LMP: Patient's last menstrual period was 03/23/2017. Average Interval: regular, 28 days Duration of flow: 4 days Heavy Menses: varies Clots: no Intermenstrual Bleeding: no Postcoital Bleeding: no Dysmenorrhea: no  The patient is sexually active. She currently uses tubal ligation for contraception. She denies dyspareunia.  The patient does perform self breast exams.  There is no notable family history of breast or ovarian cancer in her family.  The patient wears seatbelts: yes.   The patient has regular exercise: yes.    The patient denies current symptoms of depression.    Review of Systems: Review of Systems  Constitutional: Negative.   HENT: Negative.   Eyes: Negative.   Respiratory: Negative.   Cardiovascular: Negative.   Gastrointestinal: Negative.   Genitourinary: Negative.   Musculoskeletal: Negative.   Skin: Negative.   Neurological: Negative.   Endo/Heme/Allergies: Negative.   Psychiatric/Behavioral: Negative.     Past Medical History:  Past Medical History:  Diagnosis Date  . Anxiety    occasional    Past Surgical History:  Past Surgical History:  Procedure Laterality Date  . TONSILLECTOMY     31 yo  . TUBAL LIGATION Bilateral 02/01/2016   Procedure: POST PARTUM TUBAL LIGATION;  Surgeon: Vena AustriaAndreas Staebler, MD;  Location: [facility] ORS;  Service: Gynecology;  Laterality: Bilateral;    Gynecologic History:  Patient's last menstrual period was 03/23/2017. Contraception: tubal ligation Last Pap: Results were: no abnormalities   Obstetric History: G3P1003  Family History:  History reviewed. No pertinent family  history.  Social History:  Social History   Social History  . Marital status: Legally Separated    Spouse name: N/A  . Number of children: N/A  . Years of education: N/A   Occupational History  . Not on file.   Social History Main Topics  . Smoking status: Never Smoker  . Smokeless tobacco: Never Used  . Alcohol use No  . Drug use: No  . Sexual activity: Yes    Birth control/ protection: Surgical   Other Topics Concern  . Not on file   Social History Narrative  . No narrative on file    Allergies:  No Known Allergies  Medications: Prior to Admission medications   Not on File    Physical Exam Vitals: Blood pressure 118/74, height 5\' 2"  (1.575 m), weight 190 lb (86.2 kg), last menstrual period 03/23/2017, unknown if currently breastfeeding.  General: NAD HEENT: normocephalic, anicteric Thyroid: no enlargement, no palpable nodules Pulmonary: No increased work of breathing, CTAB Cardiovascular: RRR, distal pulses 2+ Breast: Breast symmetrical, no tenderness, no palpable nodules or masses, no skin or nipple retraction present, no nipple discharge.  No axillary or supraclavicular lymphadenopathy. Abdomen: NABS, soft, non-tender, non-distended.  Umbilicus without lesions.  No hepatomegaly, splenomegaly or masses palpable. No evidence of hernia  Genitourinary:  External: Normal external female genitalia.  Normal urethral meatus, normal  Bartholin's and Skene's glands.    Vagina: Normal vaginal mucosa, no evidence of prolapse.    Cervix: Grossly normal in appearance, no bleeding, no CMT  Uterus: Non-enlarged, mobile, normal contour.    Adnexa: ovaries non-enlarged, no adnexal  masses  Rectal: deferred  Lymphatic: no evidence of inguinal lymphadenopathy Extremities: no edema, erythema, or tenderness Neurologic: Grossly intact Psychiatric: mood appropriate, affect full   Assessment: 31 y.o. G3P1003 routine annual exam  Plan: Problem List Items Addressed This Visit     None    Visit Diagnoses    Well woman exam with routine gynecological exam    -  Primary   Relevant Orders   IGP, Aptima HPV   Cervical cancer screening       Relevant Orders   IGP, Aptima HPV      1) STI screening was offered and declined  2) ASCCP guidelines and rational discussed.  Patient opts for yearly screening interval  3) Contraception - Tubal ligation  4) Routine healthcare maintenance including cholesterol, diabetes screening discussed Declines   5) Continue healthy lifestyle diet and exercise  6) Follow up 1 year for routine annual exam  Tresea Mall, CNM

## 2017-04-25 LAB — IGP, APTIMA HPV
HPV Aptima: NEGATIVE
PAP Smear Comment: 0

## 2017-10-15 ENCOUNTER — Emergency Department
Admission: EM | Admit: 2017-10-15 | Discharge: 2017-10-16 | Disposition: A | Discharge status: Home / Self Care | Payer: BLUE CROSS/BLUE SHIELD | Attending: Emergency Medicine | Admitting: Emergency Medicine

## 2017-10-15 ENCOUNTER — Encounter: Payer: Self-pay | Admitting: Emergency Medicine

## 2017-10-15 ENCOUNTER — Emergency Department: Payer: BLUE CROSS/BLUE SHIELD

## 2017-10-15 ENCOUNTER — Other Ambulatory Visit: Payer: Self-pay

## 2017-10-15 DIAGNOSIS — K5900 Constipation, unspecified: Secondary | ICD-10-CM | POA: Insufficient documentation

## 2017-10-15 DIAGNOSIS — N200 Calculus of kidney: Secondary | ICD-10-CM | POA: Insufficient documentation

## 2017-10-15 DIAGNOSIS — R1032 Left lower quadrant pain: Secondary | ICD-10-CM

## 2017-10-15 DIAGNOSIS — Z3202 Encounter for pregnancy test, result negative: Secondary | ICD-10-CM | POA: Diagnosis not present

## 2017-10-15 DIAGNOSIS — R102 Pelvic and perineal pain: Secondary | ICD-10-CM | POA: Diagnosis present

## 2017-10-15 LAB — URINALYSIS, COMPLETE (UACMP) WITH MICROSCOPIC
BACTERIA UA: NONE SEEN
Bilirubin Urine: NEGATIVE
Glucose, UA: NEGATIVE mg/dL
Hgb urine dipstick: NEGATIVE
Ketones, ur: NEGATIVE mg/dL
Leukocytes, UA: NEGATIVE
NITRITE: NEGATIVE
Protein, ur: NEGATIVE mg/dL
SPECIFIC GRAVITY, URINE: 1.014 (ref 1.005–1.030)
WBC, UA: NONE SEEN WBC/hpf (ref 0–5)
pH: 5 (ref 5.0–8.0)

## 2017-10-15 LAB — POCT PREGNANCY, URINE: PREG TEST UR: NEGATIVE

## 2017-10-15 NOTE — ED Provider Notes (Signed)
[facility] Emergency Department Provider Note   First MD Initiated Contact with Patient 10/15/17 2301     (approximate)  I have reviewed the triage vital signs and the nursing notes.   HISTORY  Chief Complaint Pelvic Pain   HPI Claudia Casey is a 32 y.o. female with previous history of ovarian cyst presents to the emergency department acute onset of left pelvic pain which began last night.  Patient states current pain score is 3 out of 10.  Patient states however that there is occasional acute worsening of the pain.  Patient states pain is worse with laying flat.  Patient denies any dysuria hematuria increased frequency or urgency.  Patient denies any vaginal discharge.  Patient denies any previous history of kidney stones.  Patient denies any nausea vomiting or diarrhea.  Patient denies any fever.  Patient states last bowel movement was approximately 1 week ago.  Past Medical History:  Diagnosis Date  . Anxiety    occasional    Patient Active Problem List   Diagnosis Date Noted  . Anxiety     Past Surgical History:  Procedure Laterality Date  . TONSILLECTOMY     32 yo  . TUBAL LIGATION Bilateral 02/01/2016   Procedure: POST PARTUM TUBAL LIGATION;  Surgeon: Vena Austria, MD;  Location: [facility] ORS;  Service: Gynecology;  Laterality: Bilateral;    Prior to Admission medications   Not on File    Allergies No known drug allergies     Social History Social History   Tobacco Use  . Smoking status: Never Smoker  . Smokeless tobacco: Never Used  Substance Use Topics  . Alcohol use: No  . Drug use: No    Review of Systems Constitutional: No fever/chills Eyes: No visual changes. ENT: No sore throat. Cardiovascular: Denies chest pain. Respiratory: Denies shortness of breath. Gastrointestinal: No abdominal pain.  No nausea, no vomiting.  No diarrhea.  Positive for constipation. Genitourinary: Negative for dysuria.  Positive for left  pelvic pain  musculoskeletal: Negative for neck pain.  Negative for back pain. Integumentary: Negative for rash. Neurological: Negative for headaches, focal weakness or numbness.   ____________________________________________   PHYSICAL EXAM:  VITAL SIGNS: ED Triage Vitals  Enc Vitals Group     BP 10/15/17 2202 124/82     Pulse Rate 10/15/17 2202 90     Resp 10/15/17 2202 20     Temp 10/15/17 2202 97.7 F (36.5 C)     Temp Source 10/15/17 2202 Oral     SpO2 10/15/17 2202 100 %     Weight 10/15/17 2201 68 kg (150 lb)     Height 10/15/17 2201 1.575 m (5\' 2" )     Head Circumference --      Peak Flow --      Pain Score 10/15/17 2201 5     Pain Loc --      Pain Edu? --      Excl. in GC? --     Constitutional: Alert and oriented. Well appearing and in no acute distress. Eyes: Conjunctivae are normal.  Head: Atraumatic. Mouth/Throat: Mucous membranes are moist.  Oropharynx non-erythematous. Neck: No stridor.   Cardiovascular: Normal rate, regular rhythm. Good peripheral circulation. Grossly normal heart sounds. Respiratory: Normal respiratory effort.  No retractions. Lungs CTAB. Gastrointestinal: Soft and nontender. No distention.  Musculoskeletal: No lower extremity tenderness nor edema. No gross deformities of extremities. Neurologic:  Normal speech and language. No gross focal neurologic deficits are appreciated.  Skin:  Skin is warm, dry and intact. No rash noted.   ____________________________________________   LABS (all labs ordered are listed, but only abnormal results are displayed)  Labs Reviewed  URINALYSIS, COMPLETE (UACMP) WITH MICROSCOPIC - Abnormal; Notable for the following components:      Result Value   Color, Urine YELLOW (*)    APPearance CLEAR (*)    Squamous Epithelial / LPF 0-5 (*)    All other components within normal limits  POCT PREGNANCY, URINE   ___________________  RADIOLOGY I, Diablock N Darly Massi, personally viewed and evaluated these  images (plain radiographs) as part of my medical decision making, as well as reviewing the written report by the radiologist.   Official radiology report(s): [place] Pelvis Transvanginal Non-ob (tv Only)  Result Date: 10/16/2017 CLINICAL DATA:  Initial evaluation for acute left pelvic pain. EXAM: TRANSABDOMINAL AND TRANSVAGINAL ULTRASOUND OF PELVIS TECHNIQUE: Both transabdominal and transvaginal ultrasound examinations of the pelvis were performed. Transabdominal technique was performed for global imaging of the pelvis including uterus, ovaries, adnexal regions, and pelvic cul-de-sac. It was necessary to proceed with endovaginal exam following the transabdominal exam to visualize the uterus and ovaries. COMPARISON:  None FINDINGS: Uterus Measurements: 7.8 x 3.9 x 5.5 cm. No fibroids or other mass visualized. Prominent nabothian cyst measuring up to 1.3 cm noted at the cervix. Endometrium Thickness: 10 mm.  No focal abnormality visualized. Right ovary Measurements: 4.3 x 2.6 x 2.3 cm. Probable small corpus luteal cyst measuring 1.7 x 1.6 x 1.7 cm noted. No adnexal mass. Left ovary Measurements: 2.9 x 1.8 x 1.8 cm. Normal appearance/no adnexal mass. Other findings No abnormal free fluid. IMPRESSION: Normal pelvic ultrasound.  No acute abnormality identified. Electronically Signed   By: Rise MuBenjamin  McClintock M.D.   On: 10/16/2017 00:51   [place] Pelvis Complete  Result Date: 10/16/2017 CLINICAL DATA:  Initial evaluation for acute left pelvic pain. EXAM: TRANSABDOMINAL AND TRANSVAGINAL ULTRASOUND OF PELVIS TECHNIQUE: Both transabdominal and transvaginal ultrasound examinations of the pelvis were performed. Transabdominal technique was performed for global imaging of the pelvis including uterus, ovaries, adnexal regions, and pelvic cul-de-sac. It was necessary to proceed with endovaginal exam following the transabdominal exam to visualize the uterus and ovaries. COMPARISON:  None FINDINGS: Uterus Measurements: 7.8 x 3.9 x  5.5 cm. No fibroids or other mass visualized. Prominent nabothian cyst measuring up to 1.3 cm noted at the cervix. Endometrium Thickness: 10 mm.  No focal abnormality visualized. Right ovary Measurements: 4.3 x 2.6 x 2.3 cm. Probable small corpus luteal cyst measuring 1.7 x 1.6 x 1.7 cm noted. No adnexal mass. Left ovary Measurements: 2.9 x 1.8 x 1.8 cm. Normal appearance/no adnexal mass. Other findings No abnormal free fluid. IMPRESSION: Normal pelvic ultrasound.  No acute abnormality identified. Electronically Signed   By: Rise MuBenjamin  McClintock M.D.   On: 10/16/2017 00:51   Ct Renal Stone Study  Result Date: 10/16/2017 CLINICAL DATA:  Flank pain, pelvic pain EXAM: CT ABDOMEN AND PELVIS WITHOUT CONTRAST TECHNIQUE: Multidetector CT imaging of the abdomen and pelvis was performed following the standard protocol without IV contrast. COMPARISON:  Pelvic ultrasound performed earlier today FINDINGS: Lower chest: Lung bases are clear. No effusions. Heart is normal size. Hepatobiliary: No focal hepatic abnormality. Gallbladder unremarkable. Pancreas: No focal abnormality or ductal dilatation. Spleen: No focal abnormality.  Normal size. Adrenals/Urinary Tract: No adrenal abnormality. No focal renal abnormality. No stones or hydronephrosis. Urinary bladder is unremarkable. Stomach/Bowel: Stomach, large and small bowel grossly unremarkable. Appendix is normal. Vascular/Lymphatic: No  evidence of aneurysm or adenopathy. Reproductive: Uterus and adnexa unremarkable.  No mass. Other: No free fluid or free air. Musculoskeletal: No acute bony abnormality. IMPRESSION: No acute findings in the abdomen or pelvis. Electronically Signed   By: Charlett Nose M.D.   On: 10/16/2017 01:30     Procedures   ____________________________________________   INITIAL IMPRESSION / ASSESSMENT AND PLAN / ED COURSE  As part of my medical decision making, I reviewed the following data within the electronic MEDICAL RECORD 66 year old female  presented with above-stated history and physical exam in her left pelvic pain considered possibly of ovarian cyst and the such ultrasound was performed which revealed no evidence of a left ovarian cyst.  CT scan did reveal a small calcific structure in the area of the patient's pain and as such consider the possibility of a kidney stone versus phlebolith    ____________________________________________  FINAL CLINICAL IMPRESSION(S) / ED DIAGNOSES  Final diagnoses:  Constipation, unspecified constipation type  Kidney stone     MEDICATIONS GIVEN DURING THIS VISIT:  Medications  polyethylene glycol (MIRALAX / GLYCOLAX) packet 17 g (not administered)  ketorolac (TORADOL) tablet 10 mg (not administered)     ED Discharge Orders    None       Note:  This document was prepared using Dragon voice recognition software and may include unintentional dictation errors.    Darci Current, MD 10/16/17 [id number]

## 2017-10-15 NOTE — ED Triage Notes (Signed)
Patient ambulatory to triage with steady gait, without difficulty or distress noted; pt reports having pelvic pain with no accomp symptoms since last night; denies hx of same

## 2017-10-16 ENCOUNTER — Emergency Department: Payer: BLUE CROSS/BLUE SHIELD

## 2017-10-16 MED ORDER — POLYETHYLENE GLYCOL 3350 17 G PO PACK
17.0000 g | PACK | Freq: Every day | ORAL | Status: DC
  Administered 2017-10-16: 17 g via ORAL

## 2017-10-16 MED ORDER — KETOROLAC TROMETHAMINE 10 MG PO TABS
10.0000 mg | ORAL_TABLET | Freq: Once | ORAL | Status: AC
  Administered 2017-10-16: 10 mg via ORAL
  Filled 2017-10-16: qty 1

## 2017-10-16 MED ORDER — POLYETHYLENE GLYCOL 3350 17 G PO PACK
PACK | ORAL | Status: AC
  Filled 2017-10-16: qty 1

## 2017-10-16 NOTE — ED Notes (Signed)
Patient in US.

## 2017-10-16 NOTE — ED Notes (Signed)
Patient in CT

## 2018-03-20 ENCOUNTER — Ambulatory Visit
Admission: RE | Admit: 2018-03-20 | Discharge: 2018-03-20 | Disposition: A | Discharge status: Home / Self Care | Payer: BLUE CROSS/BLUE SHIELD | Source: Ambulatory Visit | Attending: Physical Medicine and Rehabilitation | Admitting: Physical Medicine and Rehabilitation

## 2018-03-20 ENCOUNTER — Other Ambulatory Visit: Payer: Self-pay | Admitting: Physical Medicine and Rehabilitation

## 2018-03-20 DIAGNOSIS — M5416 Radiculopathy, lumbar region: Secondary | ICD-10-CM

## 2018-03-20 DIAGNOSIS — M5127 Other intervertebral disc displacement, lumbosacral region: Secondary | ICD-10-CM | POA: Insufficient documentation

## 2018-03-20 DIAGNOSIS — M5136 Other intervertebral disc degeneration, lumbar region: Secondary | ICD-10-CM | POA: Insufficient documentation

## 2018-07-28 ENCOUNTER — Other Ambulatory Visit: Payer: Self-pay

## 2018-07-28 ENCOUNTER — Ambulatory Visit
Admission: RE | Admit: 2018-07-28 | Discharge: 2018-07-28 | Disposition: A | Discharge status: Home / Self Care | Payer: BLUE CROSS/BLUE SHIELD | Source: Ambulatory Visit | Attending: Neurosurgery | Admitting: Neurosurgery

## 2018-07-28 ENCOUNTER — Encounter
Admission: RE | Admit: 2018-07-28 | Discharge: 2018-07-28 | Disposition: A | Discharge status: Home / Self Care | Payer: BLUE CROSS/BLUE SHIELD | Source: Ambulatory Visit | Attending: Neurosurgery | Admitting: Neurosurgery

## 2018-07-28 DIAGNOSIS — Z01818 Encounter for other preprocedural examination: Secondary | ICD-10-CM

## 2018-07-28 HISTORY — DX: Attention and concentration deficit: R41.840

## 2018-07-28 LAB — URINALYSIS, COMPLETE (UACMP) WITH MICROSCOPIC
Bacteria, UA: NONE SEEN
Bilirubin Urine: NEGATIVE
GLUCOSE, UA: NEGATIVE mg/dL
Ketones, ur: NEGATIVE mg/dL
Leukocytes, UA: NEGATIVE
NITRITE: NEGATIVE
Protein, ur: NEGATIVE mg/dL
SPECIFIC GRAVITY, URINE: 1.029 (ref 1.005–1.030)
pH: 5 (ref 5.0–8.0)

## 2018-07-28 LAB — SURGICAL PCR SCREEN
MRSA, PCR: NEGATIVE
STAPHYLOCOCCUS AUREUS: POSITIVE — AB

## 2018-07-28 LAB — BASIC METABOLIC PANEL
Anion gap: 8 (ref 5–15)
BUN: 13 mg/dL (ref 6–20)
CHLORIDE: 103 mmol/L (ref 98–111)
CO2: 27 mmol/L (ref 22–32)
Calcium: 9.1 mg/dL (ref 8.9–10.3)
Creatinine, Ser: 0.6 mg/dL (ref 0.44–1.00)
GFR calc non Af Amer: >60 mL/min (ref >60)
Glucose, Bld: 83 mg/dL (ref 70–99)
POTASSIUM: 3.4 mmol/L — AB (ref 3.5–5.1)
Sodium: 138 mmol/L (ref 135–145)

## 2018-07-28 LAB — CBC
HEMATOCRIT: 42.3 % (ref 36.0–46.0)
HEMOGLOBIN: 14.4 g/dL (ref 12.0–15.0)
MCH: 31 pg (ref 26.0–34.0)
MCHC: 34 g/dL (ref 30.0–36.0)
MCV: 91 fL (ref 80.0–100.0)
Platelets: 282 10*3/uL (ref 150–400)
RBC: 4.65 MIL/uL (ref 3.87–5.11)
RDW: 12 % (ref 11.5–15.5)
WBC: 5.7 10*3/uL (ref 4.0–10.5)
nRBC: 0 % (ref 0.0–0.2)

## 2018-07-28 LAB — PROTIME-INR
INR: 0.95
Prothrombin Time: 12.6 seconds (ref 11.4–15.2)

## 2018-07-28 LAB — APTT: APTT: 29 s (ref 24–36)

## 2018-07-28 NOTE — Patient Instructions (Signed)
Your procedure is scheduled on: 08/10/18 Mon Report to Same Day Surgery 2nd floor medical mall [facility](Medical Mall Entrance-take elevator on left to 2nd floor.  Check in with surgery information desk.) To find out your arrival time please call [phone] between 1PM - 3PM on 08/07/18 Fri  Remember: Instructions that are not followed completely may result in serious medical risk, up to and including death, or upon the discretion of your surgeon and anesthesiologist your surgery may need to be rescheduled.    _x___ 1. Do not eat food after midnight the night before your procedure. You may drink clear liquids up to 2 hours before you are scheduled to arrive at the hospital for your procedure.  Do not drink clear liquids within 2 hours of your scheduled arrival to the hospital.  Clear liquids include  --Water or Apple juice without pulp  --Clear carbohydrate beverage such as ClearFast or Gatorade  --Black Coffee or Clear Tea (No milk, no creamers, do not add anything to                  the coffee or Tea Type 1 and type 2 diabetics should only drink water.   ____Ensure clear carbohydrate drink on the way to the hospital for bariatric patients  ____Ensure clear carbohydrate drink 3 hours before surgery for Dr Rutherford NailByrnett's patients if physician instructed.   No gum chewing or hard candies.     __x__ 2. No Alcohol for 24 hours before or after surgery.   __x__3. No Smoking or e-cigarettes for 24 prior to surgery.  Do not use any chewable tobacco products for at least 6 hour prior to surgery   ____  4. Bring all medications with you on the day of surgery if instructed.    __x__ 5. Notify your doctor if there is any change in your medical condition     (cold, fever, infections).    x___6. On the morning of surgery brush your teeth with toothpaste and water.  You may rinse your mouth with mouth wash if you wish.  Do not swallow any toothpaste or mouthwash.   Do not wear jewelry, make-up, hairpins,  clips or nail polish.  Do not wear lotions, powders, or perfumes. You may wear deodorant.  Do not shave 48 hours prior to surgery. Men may shave face and neck.  Do not bring valuables to the hospital.    [facility] is not responsible for any belongings or valuables.               Contacts, dentures or bridgework may not be worn into surgery.  Leave your suitcase in the car. After surgery it may be brought to your room.  For patients admitted to the hospital, discharge time is determined by your                       treatment team.  _  Patients discharged the day of surgery will not be allowed to drive home.  You will need someone to drive you home and stay with you the night of your procedure.    Please read over the following fact sheets that you were given:   [facility] Preparing for Surgery and or MRSA Information   _x___ Take anti-hypertensive listed below, cardiac, seizure, asthma,     anti-reflux and psychiatric medicines. These include:  1. gabapentin (NEURONTIN) 100 MG capsule  2.  3.  4.  5.  6.  ____Fleets enema or  Magnesium Citrate as directed.   _x___ Use CHG Soap or sage wipes as directed on instruction sheet   ____ Use inhalers on the day of surgery and bring to hospital day of surgery  ____ Stop Metformin and Janumet 2 days prior to surgery.    ____ Take 1/2 of usual insulin dose the night before surgery and none on the morning     surgery.   _x___ Follow recommendations from Cardiologist, Pulmonologist or PCP regarding          stopping Aspirin, Coumadin, Plavix ,Eliquis, Effient, or Pradaxa, and Pletal.  X____Stop Anti-inflammatories such as Advil, Aleve, Ibuprofen, Motrin, Naproxen, Naprosyn, Goodies powders or aspirin products. OK to take Tylenol and                          Celebrex.   _x___ Stop supplements until after surgery.  But may continue Vitamin D, Vitamin B,       and multivitamin.   ____ Bring C-Pap to the hospital.

## 2018-08-10 ENCOUNTER — Ambulatory Visit: Payer: BLUE CROSS/BLUE SHIELD | Admitting: Certified Registered"

## 2018-08-10 ENCOUNTER — Encounter
Admission: RE | Disposition: A | Discharge status: Home / Self Care | Payer: Self-pay | Source: Home / Self Care | Attending: Neurosurgery

## 2018-08-10 ENCOUNTER — Observation Stay
Admission: RE | Admit: 2018-08-10 | Discharge: 2018-08-11 | Disposition: A | Discharge status: Home / Self Care | Payer: BLUE CROSS/BLUE SHIELD | Attending: Neurosurgery | Admitting: Neurosurgery

## 2018-08-10 ENCOUNTER — Ambulatory Visit: Payer: BLUE CROSS/BLUE SHIELD

## 2018-08-10 ENCOUNTER — Other Ambulatory Visit: Payer: Self-pay

## 2018-08-10 DIAGNOSIS — G9741 Accidental puncture or laceration of dura during a procedure: Secondary | ICD-10-CM | POA: Insufficient documentation

## 2018-08-10 DIAGNOSIS — Z23 Encounter for immunization: Secondary | ICD-10-CM | POA: Insufficient documentation

## 2018-08-10 DIAGNOSIS — K219 Gastro-esophageal reflux disease without esophagitis: Secondary | ICD-10-CM | POA: Diagnosis not present

## 2018-08-10 DIAGNOSIS — Y92234 Operating room of hospital as the place of occurrence of the external cause: Secondary | ICD-10-CM | POA: Diagnosis not present

## 2018-08-10 DIAGNOSIS — Z419 Encounter for procedure for purposes other than remedying health state, unspecified: Secondary | ICD-10-CM

## 2018-08-10 DIAGNOSIS — M5137 Other intervertebral disc degeneration, lumbosacral region: Secondary | ICD-10-CM | POA: Insufficient documentation

## 2018-08-10 DIAGNOSIS — M4807 Spinal stenosis, lumbosacral region: Secondary | ICD-10-CM | POA: Diagnosis not present

## 2018-08-10 DIAGNOSIS — E785 Hyperlipidemia, unspecified: Secondary | ICD-10-CM | POA: Insufficient documentation

## 2018-08-10 DIAGNOSIS — Z87891 Personal history of nicotine dependence: Secondary | ICD-10-CM | POA: Diagnosis not present

## 2018-08-10 DIAGNOSIS — M5416 Radiculopathy, lumbar region: Principal | ICD-10-CM | POA: Diagnosis present

## 2018-08-10 DIAGNOSIS — I10 Essential (primary) hypertension: Secondary | ICD-10-CM | POA: Insufficient documentation

## 2018-08-10 DIAGNOSIS — Y753 Surgical instruments, materials and neurological devices (including sutures) associated with adverse incidents: Secondary | ICD-10-CM | POA: Diagnosis not present

## 2018-08-10 DIAGNOSIS — M48061 Spinal stenosis, lumbar region without neurogenic claudication: Secondary | ICD-10-CM | POA: Diagnosis present

## 2018-08-10 HISTORY — PX: LUMBAR LAMINECTOMY/DECOMPRESSION MICRODISCECTOMY: SHX5026

## 2018-08-10 LAB — POCT PREGNANCY, URINE: Preg Test, Ur: NEGATIVE

## 2018-08-10 LAB — TYPE AND SCREEN
ABO/RH(D): AB POS
Antibody Screen: NEGATIVE

## 2018-08-10 LAB — GLUCOSE, CAPILLARY: Glucose-Capillary: 182 mg/dL — ABNORMAL HIGH (ref 70–99)

## 2018-08-10 SURGERY — LUMBAR LAMINECTOMY/DECOMPRESSION MICRODISCECTOMY 1 LEVEL
Anesthesia: General | Laterality: Left

## 2018-08-10 MED ORDER — REMIFENTANIL HCL 1 MG IV SOLR
INTRAVENOUS | Status: AC
  Filled 2018-08-10: qty 1000

## 2018-08-10 MED ORDER — HYDROMORPHONE HCL 1 MG/ML IJ SOLN: 0.5000 mg | INTRAMUSCULAR | Status: DC | PRN

## 2018-08-10 MED ORDER — METHYLPREDNISOLONE ACETATE 40 MG/ML IJ SUSP
INTRAMUSCULAR | Status: DC | PRN
  Administered 2018-08-10: 40 mg

## 2018-08-10 MED ORDER — SODIUM CHLORIDE 0.9 % IV SOLN
INTRAVENOUS | Status: DC
  Administered 2018-08-10: 13:00:00 via INTRAVENOUS

## 2018-08-10 MED ORDER — SUCCINYLCHOLINE CHLORIDE 20 MG/ML IJ SOLN
INTRAMUSCULAR | Status: DC | PRN
  Administered 2018-08-10: 100 mg via INTRAVENOUS

## 2018-08-10 MED ORDER — LIDOCAINE HCL (PF) 2 % IJ SOLN
INTRAMUSCULAR | Status: AC
  Filled 2018-08-10: qty 10

## 2018-08-10 MED ORDER — ACETAMINOPHEN 10 MG/ML IV SOLN
INTRAVENOUS | Status: AC
  Filled 2018-08-10: qty 100

## 2018-08-10 MED ORDER — SODIUM CHLORIDE 0.9 % IV SOLN
INTRAVENOUS | Status: DC | PRN
  Administered 2018-08-10: 20 ug/min via INTRAVENOUS

## 2018-08-10 MED ORDER — SENNOSIDES-DOCUSATE SODIUM 8.6-50 MG PO TABS: 1.0000 | ORAL_TABLET | Freq: Every evening | ORAL | Status: DC | PRN

## 2018-08-10 MED ORDER — ONDANSETRON HCL 4 MG/2ML IJ SOLN
INTRAMUSCULAR | Status: AC
  Filled 2018-08-10: qty 2

## 2018-08-10 MED ORDER — PROPOFOL 10 MG/ML IV BOLUS
INTRAVENOUS | Status: AC
  Filled 2018-08-10: qty 20

## 2018-08-10 MED ORDER — ONDANSETRON HCL 4 MG/2ML IJ SOLN
4.0000 mg | Freq: Once | INTRAMUSCULAR | Status: AC | PRN
  Administered 2018-08-10: 4 mg via INTRAVENOUS

## 2018-08-10 MED ORDER — LIDOCAINE HCL 4 % MT SOLN
OROMUCOSAL | Status: DC | PRN
  Administered 2018-08-10: 4 mL via TOPICAL

## 2018-08-10 MED ORDER — ONDANSETRON HCL 4 MG PO TABS: 4.0000 mg | ORAL_TABLET | Freq: Four times a day (QID) | ORAL | Status: DC | PRN

## 2018-08-10 MED ORDER — ONDANSETRON HCL 4 MG/2ML IJ SOLN
4.0000 mg | Freq: Four times a day (QID) | INTRAMUSCULAR | Status: DC | PRN
  Administered 2018-08-10: 4 mg via INTRAVENOUS
  Filled 2018-08-10: qty 2

## 2018-08-10 MED ORDER — LIDOCAINE-EPINEPHRINE (PF) 1 %-1:200000 IJ SOLN
INTRAMUSCULAR | Status: AC
  Filled 2018-08-10: qty 10

## 2018-08-10 MED ORDER — SODIUM CHLORIDE 0.9 % IR SOLN
Status: DC | PRN
  Administered 2018-08-10: 1000 mL

## 2018-08-10 MED ORDER — FENTANYL CITRATE (PF) 100 MCG/2ML IJ SOLN
INTRAMUSCULAR | Status: AC
  Administered 2018-08-10: 25 ug via INTRAVENOUS
  Filled 2018-08-10: qty 2

## 2018-08-10 MED ORDER — FENTANYL CITRATE (PF) 100 MCG/2ML IJ SOLN
25.0000 ug | INTRAMUSCULAR | Status: DC | PRN
  Administered 2018-08-10 (×4): 25 ug via INTRAVENOUS

## 2018-08-10 MED ORDER — THROMBIN 5000 UNITS EX SOLR
CUTANEOUS | Status: DC | PRN
  Administered 2018-08-10: 5000 [IU] via TOPICAL

## 2018-08-10 MED ORDER — BACITRACIN 50000 UNITS IM SOLR
INTRAMUSCULAR | Status: AC
  Filled 2018-08-10: qty 1

## 2018-08-10 MED ORDER — SODIUM CHLORIDE 0.9 % IV SOLN: 250.0000 mL | INTRAVENOUS | Status: DC

## 2018-08-10 MED ORDER — LIDOCAINE-EPINEPHRINE (PF) 1 %-1:200000 IJ SOLN
INTRAMUSCULAR | Status: DC | PRN
  Administered 2018-08-10: 3.5 mL

## 2018-08-10 MED ORDER — DEXAMETHASONE SODIUM PHOSPHATE 10 MG/ML IJ SOLN
INTRAMUSCULAR | Status: DC | PRN
  Administered 2018-08-10: 10 mg via INTRAVENOUS

## 2018-08-10 MED ORDER — SODIUM CHLORIDE 0.9% FLUSH: 3.0000 mL | INTRAVENOUS | Status: DC | PRN

## 2018-08-10 MED ORDER — LACTATED RINGERS IV SOLN
INTRAVENOUS | Status: DC
  Administered 2018-08-10: 07:00:00 via INTRAVENOUS

## 2018-08-10 MED ORDER — ACETAMINOPHEN 500 MG PO TABS
1000.0000 mg | ORAL_TABLET | Freq: Four times a day (QID) | ORAL | Status: DC
  Administered 2018-08-10: 1000 mg via ORAL
  Filled 2018-08-10 (×2): qty 2

## 2018-08-10 MED ORDER — ONDANSETRON HCL 4 MG/2ML IJ SOLN
INTRAMUSCULAR | Status: DC | PRN
  Administered 2018-08-10: 4 mg via INTRAVENOUS

## 2018-08-10 MED ORDER — PHENOL 1.4 % MT LIQD
1.0000 | OROMUCOSAL | Status: DC | PRN
  Filled 2018-08-10: qty 177

## 2018-08-10 MED ORDER — BUPIVACAINE HCL (PF) 0.5 % IJ SOLN
INTRAMUSCULAR | Status: AC
  Filled 2018-08-10: qty 30

## 2018-08-10 MED ORDER — METHOCARBAMOL 500 MG PO TABS
500.0000 mg | ORAL_TABLET | Freq: Four times a day (QID) | ORAL | Status: DC | PRN
  Administered 2018-08-10: 500 mg via ORAL
  Filled 2018-08-10: qty 1

## 2018-08-10 MED ORDER — DEXMEDETOMIDINE HCL IN NACL 200 MCG/50ML IV SOLN
INTRAVENOUS | Status: AC
  Filled 2018-08-10: qty 50

## 2018-08-10 MED ORDER — OXYCODONE HCL 5 MG PO TABS: 10.0000 mg | ORAL_TABLET | ORAL | Status: DC | PRN

## 2018-08-10 MED ORDER — OXYCODONE HCL 5 MG PO TABS
5.0000 mg | ORAL_TABLET | ORAL | Status: DC | PRN
  Administered 2018-08-10 – 2018-08-11 (×3): 5 mg via ORAL
  Filled 2018-08-10 (×3): qty 1

## 2018-08-10 MED ORDER — SODIUM CHLORIDE FLUSH 0.9 % IV SOLN
INTRAVENOUS | Status: AC
  Filled 2018-08-10: qty 30

## 2018-08-10 MED ORDER — AMPHETAMINE-DEXTROAMPHET ER 5 MG PO CP24
25.0000 mg | ORAL_CAPSULE | Freq: Every day | ORAL | Status: DC
  Filled 2018-08-10: qty 5

## 2018-08-10 MED ORDER — FENTANYL CITRATE (PF) 100 MCG/2ML IJ SOLN
INTRAMUSCULAR | Status: DC | PRN
  Administered 2018-08-10 (×2): 50 ug via INTRAVENOUS

## 2018-08-10 MED ORDER — PHENYLEPHRINE HCL 10 MG/ML IJ SOLN
INTRAMUSCULAR | Status: AC
  Filled 2018-08-10: qty 1

## 2018-08-10 MED ORDER — KETAMINE HCL 50 MG/ML IJ SOLN
INTRAMUSCULAR | Status: DC | PRN
  Administered 2018-08-10: 25 mg via INTRAVENOUS
  Administered 2018-08-10: 20 mg via INTRAMUSCULAR

## 2018-08-10 MED ORDER — DEXMEDETOMIDINE HCL IN NACL 200 MCG/50ML IV SOLN
INTRAVENOUS | Status: DC | PRN
  Administered 2018-08-10: 12 ug via INTRAVENOUS
  Administered 2018-08-10: 8 ug via INTRAVENOUS

## 2018-08-10 MED ORDER — MIDAZOLAM HCL 2 MG/2ML IJ SOLN
INTRAMUSCULAR | Status: AC
  Filled 2018-08-10: qty 2

## 2018-08-10 MED ORDER — SUCCINYLCHOLINE CHLORIDE 20 MG/ML IJ SOLN
INTRAMUSCULAR | Status: AC
  Filled 2018-08-10: qty 1

## 2018-08-10 MED ORDER — ACETAMINOPHEN 650 MG RE SUPP: 650.0000 mg | RECTAL | Status: DC | PRN

## 2018-08-10 MED ORDER — GLYCOPYRROLATE 0.2 MG/ML IJ SOLN
INTRAMUSCULAR | Status: AC
  Filled 2018-08-10: qty 1

## 2018-08-10 MED ORDER — BUPIVACAINE HCL 0.5 % IJ SOLN
INTRAMUSCULAR | Status: DC | PRN
  Administered 2018-08-10: 3.5 mL

## 2018-08-10 MED ORDER — METHYLPREDNISOLONE ACETATE 40 MG/ML IJ SUSP
INTRAMUSCULAR | Status: AC
  Filled 2018-08-10: qty 1

## 2018-08-10 MED ORDER — FENTANYL CITRATE (PF) 100 MCG/2ML IJ SOLN
INTRAMUSCULAR | Status: AC
  Filled 2018-08-10: qty 2

## 2018-08-10 MED ORDER — INFLUENZA VAC SPLIT QUAD 0.5 ML IM SUSY
0.5000 mL | PREFILLED_SYRINGE | INTRAMUSCULAR | Status: AC
  Administered 2018-08-11: 0.5 mL via INTRAMUSCULAR
  Filled 2018-08-10: qty 0.5

## 2018-08-10 MED ORDER — FAMOTIDINE 20 MG PO TABS
ORAL_TABLET | ORAL | Status: AC
  Administered 2018-08-10: 20 mg via ORAL
  Filled 2018-08-10: qty 1

## 2018-08-10 MED ORDER — SODIUM CHLORIDE (PF) 0.9 % IJ SOLN
INTRAMUSCULAR | Status: AC
  Filled 2018-08-10: qty 20

## 2018-08-10 MED ORDER — THROMBIN 5000 UNITS EX SOLR
CUTANEOUS | Status: AC
  Filled 2018-08-10: qty 5000

## 2018-08-10 MED ORDER — SODIUM CHLORIDE 0.9% FLUSH: 3.0000 mL | Freq: Two times a day (BID) | INTRAVENOUS | Status: DC

## 2018-08-10 MED ORDER — METHOCARBAMOL 1000 MG/10ML IJ SOLN
500.0000 mg | Freq: Four times a day (QID) | INTRAVENOUS | Status: DC | PRN
  Filled 2018-08-10: qty 5

## 2018-08-10 MED ORDER — DEXAMETHASONE SODIUM PHOSPHATE 4 MG/ML IJ SOLN
4.0000 mg | Freq: Once | INTRAMUSCULAR | Status: AC
  Administered 2018-08-10: 4 mg via INTRAVENOUS
  Filled 2018-08-10: qty 1

## 2018-08-10 MED ORDER — ROCURONIUM BROMIDE 50 MG/5ML IV SOLN
INTRAVENOUS | Status: AC
  Filled 2018-08-10: qty 1

## 2018-08-10 MED ORDER — AMPHETAMINE-DEXTROAMPHETAMINE 5 MG PO TABS: 10.0000 mg | ORAL_TABLET | Freq: Every day | ORAL | Status: DC | PRN

## 2018-08-10 MED ORDER — ACETAMINOPHEN 10 MG/ML IV SOLN
INTRAVENOUS | Status: DC | PRN
  Administered 2018-08-10: 1000 mg via INTRAVENOUS

## 2018-08-10 MED ORDER — CEFAZOLIN SODIUM-DEXTROSE 2-4 GM/100ML-% IV SOLN
2.0000 g | Freq: Once | INTRAVENOUS | Status: AC
  Administered 2018-08-10: 2 g via INTRAVENOUS

## 2018-08-10 MED ORDER — PROPOFOL 10 MG/ML IV BOLUS
INTRAVENOUS | Status: DC | PRN
  Administered 2018-08-10: 40 mg via INTRAVENOUS
  Administered 2018-08-10: 160 mg via INTRAVENOUS

## 2018-08-10 MED ORDER — GLYCOPYRROLATE 0.2 MG/ML IJ SOLN
INTRAMUSCULAR | Status: DC | PRN
  Administered 2018-08-10: 0.2 mg via INTRAVENOUS

## 2018-08-10 MED ORDER — ROCURONIUM BROMIDE 100 MG/10ML IV SOLN
INTRAVENOUS | Status: DC | PRN
  Administered 2018-08-10: 5 mg via INTRAVENOUS
  Administered 2018-08-10: 15 mg via INTRAVENOUS

## 2018-08-10 MED ORDER — DEXAMETHASONE SODIUM PHOSPHATE 10 MG/ML IJ SOLN
INTRAMUSCULAR | Status: AC
  Filled 2018-08-10: qty 1

## 2018-08-10 MED ORDER — SCOPOLAMINE 1 MG/3DAYS TD PT72
1.0000 | MEDICATED_PATCH | TRANSDERMAL | Status: DC
  Administered 2018-08-10: 1.5 mg via TRANSDERMAL
  Filled 2018-08-10: qty 1

## 2018-08-10 MED ORDER — ACETAMINOPHEN 325 MG PO TABS: 650.0000 mg | ORAL_TABLET | ORAL | Status: DC | PRN

## 2018-08-10 MED ORDER — CEFAZOLIN SODIUM-DEXTROSE 2-4 GM/100ML-% IV SOLN
INTRAVENOUS | Status: AC
  Filled 2018-08-10: qty 100

## 2018-08-10 MED ORDER — MIDAZOLAM HCL 2 MG/2ML IJ SOLN
INTRAMUSCULAR | Status: DC | PRN
  Administered 2018-08-10: 2 mg via INTRAVENOUS

## 2018-08-10 MED ORDER — BUTALBITAL-APAP-CAFFEINE 50-325-40 MG PO TABS
1.0000 | ORAL_TABLET | Freq: Four times a day (QID) | ORAL | Status: DC | PRN
  Filled 2018-08-10: qty 1

## 2018-08-10 MED ORDER — EPHEDRINE SULFATE 50 MG/ML IJ SOLN
INTRAMUSCULAR | Status: DC | PRN
  Administered 2018-08-10: 5 mg via INTRAVENOUS

## 2018-08-10 MED ORDER — KETAMINE HCL 50 MG/ML IJ SOLN
INTRAMUSCULAR | Status: AC
  Filled 2018-08-10: qty 10

## 2018-08-10 MED ORDER — MENTHOL 3 MG MT LOZG
1.0000 | LOZENGE | OROMUCOSAL | Status: DC | PRN
  Filled 2018-08-10: qty 9

## 2018-08-10 MED ORDER — LIDOCAINE HCL (CARDIAC) PF 100 MG/5ML IV SOSY
PREFILLED_SYRINGE | INTRAVENOUS | Status: DC | PRN
  Administered 2018-08-10: 50 mg via INTRAVENOUS

## 2018-08-10 MED ORDER — FAMOTIDINE 20 MG PO TABS
20.0000 mg | ORAL_TABLET | Freq: Once | ORAL | Status: AC
  Administered 2018-08-10: 20 mg via ORAL

## 2018-08-10 MED ORDER — GABAPENTIN 100 MG PO CAPS
200.0000 mg | ORAL_CAPSULE | Freq: Three times a day (TID) | ORAL | Status: DC
  Administered 2018-08-10 (×2): 200 mg via ORAL
  Filled 2018-08-10 (×2): qty 2

## 2018-08-10 MED ORDER — REMIFENTANIL HCL 1 MG IV SOLR
INTRAVENOUS | Status: DC | PRN
  Administered 2018-08-10: .1 ug/kg/min via INTRAVENOUS

## 2018-08-10 SURGICAL SUPPLY — 71 items
ADH SKN CLS APL DERMABOND .7 (GAUZE/BANDAGES/DRESSINGS) ×1
AGENT HMST MTR 8 SURGIFLO (HEMOSTASIS) ×1
APL SRG 60D 8 XTD TIP BNDBL (TIP) ×1
BUR NEURO DRILL SOFT 3.0X3.8M (BURR) ×3 IMPLANT
CANISTER SUCT 1200ML W/VALVE (MISCELLANEOUS) ×6 IMPLANT
CHLORAPREP W/TINT 26ML (MISCELLANEOUS) ×6 IMPLANT
COUNTER NEEDLE 20/40 LG (NEEDLE) ×3 IMPLANT
COVER LIGHT HANDLE STERIS (MISCELLANEOUS) ×6 IMPLANT
COVER WAND RF STERILE (DRAPES) ×1 IMPLANT
CUP MEDICINE 2OZ PLAST GRAD ST (MISCELLANEOUS) ×3 IMPLANT
DERMABOND ADVANCED (GAUZE/BANDAGES/DRESSINGS) ×2
DERMABOND ADVANCED .7 DNX12 (GAUZE/BANDAGES/DRESSINGS) ×1 IMPLANT
DRAPE C-ARM 42X72 X-RAY (DRAPES) ×4 IMPLANT
DRAPE C-ARMOR (DRAPES) ×2 IMPLANT
DRAPE LAPAROTOMY 100X77 ABD (DRAPES) ×3 IMPLANT
DRAPE MICROSCOPE SPINE 48X150 (DRAPES) ×2 IMPLANT
DRAPE SURG 17X11 SM STRL (DRAPES) ×3 IMPLANT
DRSG TEGADERM 2-3/8X2-3/4 SM (GAUZE/BANDAGES/DRESSINGS) ×2 IMPLANT
DURASEAL APPLICATOR TIP (TIP) ×2 IMPLANT
DURASEAL SPINE SEALANT 3ML (MISCELLANEOUS) ×2 IMPLANT
ELECT CAUTERY BLADE TIP 2.5 (TIP) ×3
ELECT EZSTD 165MM 6.5IN (MISCELLANEOUS) ×3
ELECT REM PT RETURN 9FT ADLT (ELECTROSURGICAL) ×3
ELECTRODE CAUTERY BLDE TIP 2.5 (TIP) ×1 IMPLANT
ELECTRODE EZSTD 165MM 6.5IN (MISCELLANEOUS) ×1 IMPLANT
ELECTRODE REM PT RTRN 9FT ADLT (ELECTROSURGICAL) ×1 IMPLANT
GAUZE SPONGE 4X4 12PLY STRL (GAUZE/BANDAGES/DRESSINGS) ×3 IMPLANT
GLOVE BIOGEL PI IND STRL 7.0 (GLOVE) ×1 IMPLANT
GLOVE BIOGEL PI IND STRL 8 (GLOVE) ×1 IMPLANT
GLOVE BIOGEL PI INDICATOR 7.0 (GLOVE) ×2
GLOVE BIOGEL PI INDICATOR 8 (GLOVE) ×2
GLOVE SURG SYN 6.5 ES PF (GLOVE) ×6 IMPLANT
GLOVE SURG SYN 6.5 PF PI (GLOVE) ×2 IMPLANT
GLOVE SURG SYN 8.0 (GLOVE) ×9 IMPLANT
GLOVE SURG SYN 8.0 PF PI (GLOVE) ×3 IMPLANT
GOWN STRL REUS W/ TWL XL LVL3 (GOWN DISPOSABLE) ×1 IMPLANT
GOWN STRL REUS W/TWL MED LVL3 (GOWN DISPOSABLE) ×3 IMPLANT
GOWN STRL REUS W/TWL XL LVL3 (GOWN DISPOSABLE) ×3
GRADUATE 1200CC STRL 31836 (MISCELLANEOUS) ×3 IMPLANT
GRAFT DURAGEN MATRIX 1WX1L (Tissue) ×2 IMPLANT
KIT TURNOVER KIT A (KITS) ×3 IMPLANT
KIT WILSON FRAME (KITS) ×3 IMPLANT
KNIFE BAYONET SHORT DISCETOMY (MISCELLANEOUS) ×2 IMPLANT
MARKER SKIN DUAL TIP RULER LAB (MISCELLANEOUS) ×6 IMPLANT
NDL SAFETY ECLIPSE 18X1.5 (NEEDLE) ×1 IMPLANT
NEEDLE HYPO 18GX1.5 SHARP (NEEDLE) ×3
NEEDLE HYPO 22GX1.5 SAFETY (NEEDLE) ×3 IMPLANT
NS IRRIG 1000ML POUR BTL (IV SOLUTION) ×3 IMPLANT
PACK LAMINECTOMY NEURO (CUSTOM PROCEDURE TRAY) ×3 IMPLANT
PAD ARMBOARD 7.5X6 YLW CONV (MISCELLANEOUS) ×3 IMPLANT
SPOGE SURGIFLO 8M (HEMOSTASIS) ×2
SPONGE GAUZE 2X2 8PLY STER LF (GAUZE/BANDAGES/DRESSINGS) ×1
SPONGE GAUZE 2X2 8PLY STRL LF (GAUZE/BANDAGES/DRESSINGS) ×1 IMPLANT
SPONGE SURGIFLO 8M (HEMOSTASIS) ×1 IMPLANT
STAPLER SKIN PROX 35W (STAPLE) IMPLANT
SUT ETHILON NAB PS2 4-0 18IN (SUTURE) ×2 IMPLANT
SUT NURALON 4 0 TR CR/8 (SUTURE) IMPLANT
SUT PDSII 5-0 (SUTURE) ×2 IMPLANT
SUT POLYSORB 2-0 5X18 GS-10 (SUTURE) ×5 IMPLANT
SUT PROLENE 5 0 RB 1 DA (SUTURE) ×2 IMPLANT
SUT VIC AB 0 CT1 18XCR BRD 8 (SUTURE) ×1 IMPLANT
SUT VIC AB 0 CT1 8-18 (SUTURE) ×3
SUT VIC AB 3-0 SH 8-18 (SUTURE) ×2 IMPLANT
SYR 10ML LL (SYRINGE) ×6 IMPLANT
SYR 30ML LL (SYRINGE) ×3 IMPLANT
SYR 3ML LL SCALE MARK (SYRINGE) ×3 IMPLANT
TOWEL OR 17X26 4PK STRL BLUE (TOWEL DISPOSABLE) ×12 IMPLANT
TUBE MATRX SPINL 18MM 6CM DISP (INSTRUMENTS) ×3
TUBE METRX SPINAL 18X6 DISP (INSTRUMENTS) IMPLANT
TUBING CONNECTING 10 (TUBING) ×2 IMPLANT
TUBING CONNECTING 10' (TUBING) ×1

## 2018-08-10 NOTE — Progress Notes (Signed)
Pt admitted to arm 145, oriented to unit, call bell, and safety measures. Pt oriented to plan of care. Educated to lie flat until 10 pm, and not to twist, bend, arch. Zofran and scopolamine patch administered for nausea and dizziness are effective. Pt denies tingling and has full sensation and movement to all extremities. Surgical dressing clean, dry, intact. Will continue to monitor.

## 2018-08-10 NOTE — H&P (Addendum)
History & Physical   Date of Service: 07/15/2016     Time: 12:32 PM  Chief Complaint: Left leg pain   History of Present Illness: Claudia Casey is a 32 [place] [id number] with left leg pain She complains of ongoing pain that travels from her back into the posterior side of her left leg. She says this started approximately 6 months ago. She has been seen by physiatry and undergone multiple steroid injections, none of which provided more than a day relief. She has been placed on gabapentin which she does this helps some. Pain will travel down the posterior leg into the calf and at times further down. It is a sensation of tightening and burning. She denies any right leg symptoms. She did see a chiropractor but this did not provide any relief. Previously been to physical therapy.  She was recently married. 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 We discussed surgery for decompression and she wishes to proceed  Past Medical History:  Diagnosis Date  . GERD (gastroesophageal reflux disease)   . Hyperlipidemia   . Hypertension    Past Surgical History:  Procedure Laterality Date  . cyst removal from spine     "about 50 years ago"  . Hardware removal, excision of osteophyte and debridement of gouty tophus, right index dip joint  Right 05/08/2016   Dr.Poggi   . KNEE ARTHROSCOPY Right 11/2011  . Pin put in right hand index finger     Family History  Problem Relation Age of Onset  . No Known Problems Mother   . No Known Problems Father    Social History   Social History  . Marital status: Single    Spouse name: N/A  . Number of children: N/A  . Years of education: N/A   Social History Main Topics  . Smoking status: Former Smoker    Quit date: 11/17/1954  . Smokeless tobacco: Never Used  . Alcohol use 0.0 oz/week  . Drug use: No  . Sexual activity: Not Asked   Other Topics Concern  . None   Social History Narrative  . None      No Known Allergies  Medications: Cannot display prior to admission medications because the patient has not been admitted in this contact.    Review of Systems:   General ROS: Negative Psychological ROS: Negative Ophthalmic ROS: Negative ENT ROS: Negative Hematological and Lymphatic ROS: Negative  Endocrine ROS: Negative Respiratory ROS: Negative Cardiovascular ROS: Negative Gastrointestinal ROS: Negative Genito-Urinary ROS: Negative Musculoskeletal ROS: Negative for back pain Neurological ROS: Positive for leg pain and numbness Dermatological ROS: Negative  Physical Exam: Temp:  [97.4 F (36.3 C)] 97.4 F (36.3 C) (12/23 13080611) Pulse Rate:  [72] 72 (12/23 0611) Resp:  [16] 16 (12/23 0611) BP: (116)/(90) 116/90 (12/23 0611) SpO2:  [100 %] 100 % (12/23 0611) Temp (24hrs), Avg:97.4 F (36.3 C), Min:97.4 F (36.3 C), Max:97.4 F (36.3 C)  Weight: 62.2 kg (137 lb 3.2 oz)  General appearance: Alert, cooperative, in no acute distress Head: Normocephalic, atraumatic Eyes: Normal, EOM intact Oropharynx: Moist without lesions CV: Regular rate Pulm: Clear to auscultation Ext: No edema in LE bilaterally, warm extremities  Neurologic exam:  Mental status: alertness: alert, affect: normal Speech: fluent and clear Motor:strength symmetric 5/5 in bilateral lower extremities in all motor groups Sensory: intact to light touch in bilateral lower extremities Reflexes: 2+ at bilateral patella, right ankle. 1+ at left ankle Gait:  normal   Data: MRI lumbar spine: There is a normal lordotic curvature with overall normal alignment. 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 This results and stenosis at this level which is more eccentric to the left. There are no other levels of stenosis identified    Assessment & Plan:  Active Problems: Left S1 radiculopathy    1. Plan for left L5/S1 Hemilaminectomy and decompression  VTE Prophylaxis: SCDs  Code Status: Full  Code  Discharge Planning: Home today   Nathaniel ManSTEVEN HENRY Ivar Domangue, MD 08/10/18   Patient seen in PACU, ready to proceed

## 2018-08-10 NOTE — Progress Notes (Signed)
Procedure: L5-S1 lumbar decompression Procedure date: 08/10/2018 Diagnosis: Lumbar radiculopathy  History: Claudia Branchabitha S Gilden is status post L5-S1 lumbar decompression for lumbar radiculopathy.  She was seen postoperatively still disoriented from anesthesia.  Complains of nausea and needing to void.  Able to determine if symptoms that were present prior to surgery resolved.  Update: Patient has been transferred to floor.  Dr. Adriana Simasook has seen her and she continues to complain of nausea but denies any headache/back pain/lower extremity pain.  Physical Exam: Vitals:   08/10/18 1140 08/10/18 1206  BP: (!) 96/51 (!) 96/57  Pulse:  62  Resp:  18  Temp: 97.6 F (36.4 C) (!) 97.5 F (36.4 C)  SpO2:  95%    AA Ox3 Skin: Lumbar incision dressing clean and dry Strength:5/5 throughout left lower extremity Sensation: Unable to accurately assess.  Data:  No results for input(s): NA, K, CL, CO2, BUN, CREATININE, LABGLOM, GLUCOSE, CALCIUM in the last 168 hours. No results for input(s): AST, ALT, ALKPHOS in the last 168 hours.  Invalid input(s): TBILI   No results for input(s): WBC, HGB, HCT, PLT in the last 168 hours. No results for input(s): APTT, INR in the last 168 hours.       Other tests/results: No imaging reviewed  Assessment/Plan:  Claudia Casey is POD 0 status post L5-S1 lumbar decompression for lumbar radiculopathy.  Experienced CSF leak and admitted for observation and under orders to lay flat for 12 hours.  Fioricet provided as needed for headache/migraine.  -External cath - pain control - DVT prophylaxis  Ivar DrapeAmanda Gabrielle Mester PA-C Department of Neurosurgery

## 2018-08-10 NOTE — Anesthesia Preprocedure Evaluation (Signed)
Anesthesia Evaluation  Patient identified by MRN, date of birth, ID band Patient awake    Reviewed: Allergy & Precautions, H&P , NPO status , Patient's Chart, lab work & pertinent test results, reviewed documented beta blocker date and time   Airway Mallampati: II  TM Distance: >3 FB Neck ROM: full    Dental  (+) Teeth Intact   Pulmonary neg pulmonary ROS,    Pulmonary exam normal        Cardiovascular negative cardio ROS Normal cardiovascular exam Rhythm:regular Rate:Normal     Neuro/Psych negative neurological ROS  negative psych ROS   GI/Hepatic negative GI ROS, Neg liver ROS,   Endo/Other  negative endocrine ROS  Renal/GU negative Renal ROS  negative genitourinary   Musculoskeletal   Abdominal   Peds  Hematology negative hematology ROS (+)   Anesthesia Other Findings Past Medical History: No date: Anxiety     Comment:  occasional No date: Attention deficit Past Surgical History: No date: TONSILLECTOMY     Comment:  32 yo 02/01/2016: TUBAL LIGATION; Bilateral     Comment:  Procedure: POST PARTUM TUBAL LIGATION;  Surgeon: Vena AustriaAndreas              Staebler, MD;  Location: [facility] ORS;  Service: Gynecology;               Laterality: Bilateral;   Reproductive/Obstetrics negative OB ROS                             Anesthesia Physical Anesthesia Plan  ASA: II  Anesthesia Plan: General ETT   Post-op Pain Management:    Induction:   PONV Risk Score and Plan:   Airway Management Planned:   Additional Equipment:   Intra-op Plan:   Post-operative Plan:   Informed Consent: I have reviewed the patients History and Physical, chart, labs and discussed the procedure including the risks, benefits and alternatives for the proposed anesthesia with the patient or authorized representative who has indicated his/her understanding and acceptance.   Dental Advisory Given  Plan Discussed with:  CRNA  Anesthesia Plan Comments:         Anesthesia Quick Evaluation

## 2018-08-10 NOTE — Discharge Summary (Signed)
Procedure: L5-S1 lumbar decompression Procedure date: 08/10/2018 Diagnosis: Lumbar radiculopathy  Interval History: Claudia Casey is POD1 s/p left S1/S2 lumbar decompression for lumbar radiculopathy.  Physical Exam: Vitals:   08/10/18 0611  BP: 116/90  Pulse: 72  Resp: 16  Temp: (!) 97.4 F (36.3 C)  SpO2: 100%   Discharge Exam AA Ox3 Skin: Dressing clean and dry Strength:5/5 throughout Sensation: intact and symmetric throughout bilateral lower extremities.   Discharge Medications:  Adderall XR 25mg  daily Gabapentin 200mg  TID Oxycodone 5mg  q6h prn pain Methocarbamol 500mg  qid prn Ibuprofen prn Acetaminophen prn  Hospital Course:  Claudia Branchabitha S Simerly underwent S1/S1 decompression on 12/24 which was complicated by a small durotomy. This was repaired and the case was routine. She was admitted to the ward post-operatively for monitoring. She did well after treatment of post-op nausea. She was kept flat and had bed raised overnight. She voided and tolerated a diet. The following morning, she was out of bed and walking. There were no complaints of headaches but she was dealing with post operative back pain. She was ready for discharge home.   Lucy ChrisSteven Lavert Matousek, MD Department of Neurosurgery

## 2018-08-10 NOTE — Anesthesia Procedure Notes (Signed)
Procedure Name: Intubation Performed by: Rolla Plate, CRNA Pre-anesthesia Checklist: Patient identified, Patient being monitored, Timeout performed, Emergency Drugs available and Suction available Patient Re-evaluated:Patient Re-evaluated prior to induction Oxygen Delivery Method: Circle system utilized Preoxygenation: Pre-oxygenation with 100% oxygen Induction Type: IV induction Ventilation: Mask ventilation without difficulty Laryngoscope Size: Miller and 2 Grade View: Grade I Tube type: Oral Tube size: 7.0 mm Number of attempts: 1 Airway Equipment and Method: Stylet and LTA kit utilized Placement Confirmation: ETT inserted through vocal cords under direct vision,  positive ETCO2 and breath sounds checked- equal and bilateral Secured at: 21 cm Tube secured with: Tape Dental Injury: Teeth and Oropharynx as per pre-operative assessment

## 2018-08-10 NOTE — Op Note (Signed)
Operative Note  SURGERY DATE:08/10/2018  PRE-OP DIAGNOSIS: Lumbar Stenosis withLumbar Radiculopathy(m48.062)  POST-OP DIAGNOSIS:Post-Op Diagnosis Codes: Lumbar Stenosis withLumbar Radiculopathy(m48.062)  Procedure(s) with comments: Left S1/S2 Hemilaminectomywith Facetectomy and Foraminotomy  SURGEON:  * Nathaniel ManSteven Henry Jaquavion Mccannon, MD Ivar DrapeAmanda Ferri, PA Assistant  ANESTHESIA:General  OPERATIVE FINDINGS: Lateral recess stenosisat left S1/S2  OPERATIVE REPORT:   Indication: Claudia Casey to clinic on11/12with ongoingleftleg pain.She had failed conservative management including PT, steroid injections, and prescription medications. MRI revealed left S1/S2stenosis compressingthetraversingnerve root from a combination of degenerative disease and small canal..Therisks of surgery were explained to include hematoma, infection, damage to nerve roots, CSF leak, weakness, numbness, pain, need for future surgery including fusion, heart attack, and stroke.She elected to proceed with surgery for symptom relief.   Procedure The patient was brought to the OR after informed consent was obtained.She was given general anesthesia and intubated by the anesthesia service. Vascular access lines were placed.The patient was then placed prone on a Wilson frameensuring all pressure points were padded.Antibiotics were administered.A time-out was performed per protocol.   The patient was sterilely prepped and draped. Fluoroscopy confirmedS1/S2interspaceandanincision was planned1.5cm off midline on the left.The incision was instilled withlocal anesthetic with epinephrine. The skin was opened sharply and the dissection taken to the fascia. This was incised and initial dilator placedthe spinous processes and lamina of S1 on the leftout to the medial edge of the facet. Serial dilatorswere inserted via fluoroscopy and the final 18mm tube was placed at depth of  6cm.  The microscope was brought into the field. The overlying muscle was removed from lamina and medial facet.Next, a matchstickdrill bit was used to remove the S1 lamina centrallyand going laterally.The underlying ligament was freed and removed with combination of rongeurs. The decompression was taken caudal to the superior border ofS2and then the medial facet was removed. The dura was seen to be full and intact.Once all ligament and soft tissue was removed, attention was turned to inspection of the nerve root. The nerve root and thecal sac was retracted and there was a small osteophyte without any obvious disc herniation. The blunt tip probe was palpated around the nerve and no compression was found from that point and distally.  Proximally, there was some compression from S1 on the shoulder of the nerve root. While this was being removed with rongeurs, a small durotomy occurred. The S1 lamina was removed more rostrally to allow for visualization of the dura and an approximate 5mm dural opening was seen. The remaining compression of nerve root laterally was removed.   Next, the dural edge was retracted to ensure all nerve roots were intact and within thecal sac. A 4-0 suture was used to reapproximate the dural edges and there was a good closure of this with the suture. Multiple rounds of irrigation were used.  A small piece of Duragen was placed over this as well as Duraseal. Solumedrol was then placed along the traversing nerve root. Hemostasis was obtained. The microscope was removed.  The muscle andfasciawas then closed using 0 and 2-0 vicryl followed by thesubcutaneous and dermal layers with 2-0 vicryl and 3-0 vicryluntil the epidermis was well approximated. The skin was closed with running Nylon. A dressing was applied.  The patient was returned to supine position and extubated by the anesthesia service. The patient was then taken to the PACU for post-operative care whereshe was  moving extremities symmetrically.   ESTIMATED BLOOD LOSS: 20cc  SPECIMENS None  IMPLANT None   I performed the case in its entiretywith assistance  of PA, Claudia Casey  Claudia Goettel, MD [phone]

## 2018-08-10 NOTE — Anesthesia Post-op Follow-up Note (Signed)
Anesthesia QCDR form completed.        

## 2018-08-10 NOTE — Transfer of Care (Signed)
Immediate Anesthesia Transfer of Care Note  Patient: Claudia Casey  Procedure(s) Performed: LUMBAR LAMINECTOMY/DECOMPRESSION MICRODISCECTOMY 1 LEVEL-L5-S1(HEMILAMINOTOMY,FORAMINOTOMY) (Left )  Patient Location: PACU  Anesthesia Type:General  Level of Consciousness: awake  Airway & Oxygen Therapy: Patient Spontanous Breathing and Patient connected to face mask oxygen  Post-op Assessment: Report given to RN, Post -op Vital signs reviewed and stable and Patient moving all extremities  Post vital signs: Reviewed  Last Vitals:  Vitals Value Taken Time  BP 126/62 08/10/2018 10:32 AM  Temp 36.1 C 08/10/2018 10:32 AM  Pulse 80 08/10/2018 10:33 AM  Resp 15 08/10/2018 10:33 AM  SpO2 99 % 08/10/2018 10:33 AM  Vitals shown include unvalidated device data.  Last Pain:  Vitals:   08/10/18 0611  TempSrc: Tympanic  PainSc: 7          Complications: No apparent anesthesia complications

## 2018-08-11 ENCOUNTER — Encounter: Payer: Self-pay | Admitting: Neurosurgery

## 2018-08-11 DIAGNOSIS — M5416 Radiculopathy, lumbar region: Secondary | ICD-10-CM | POA: Diagnosis not present

## 2018-08-11 MED ORDER — OXYCODONE HCL 5 MG PO TABS: 5.0000 mg | ORAL_TABLET | Freq: Four times a day (QID) | ORAL | 0 refills | Status: DC | PRN

## 2018-08-11 MED ORDER — SENNOSIDES-DOCUSATE SODIUM 8.6-50 MG PO TABS: 1.0000 | ORAL_TABLET | Freq: Two times a day (BID) | ORAL | 0 refills | Status: DC

## 2018-08-11 MED ORDER — METHOCARBAMOL 500 MG PO TABS: 500.0000 mg | ORAL_TABLET | Freq: Four times a day (QID) | ORAL | 1 refills | Status: DC | PRN

## 2018-08-11 NOTE — Progress Notes (Signed)
Discharge summary reviewed with verbal understanding. 1 Rx of oxy given upon discharge

## 2018-08-11 NOTE — Discharge Instructions (Signed)
Your surgeon has performed an operation on your lumbar spine (low back) to relieve pressure on one or more nerves. Many times, patients feel better immediately after surgery and can overdo it. Even if you feel well, it is important that you follow these activity guidelines. If you do not let your back heal properly from the surgery, you can increase the chance of return of your symptoms. The following are instructions to help in your recovery once you have been discharged from the hospital.   Activity    No bending, lifting, or twisting (BLT). Avoid lifting objects heavier than 10 pounds (gallon milk jug).  Where possible, avoid household activities that involve lifting, bending, pushing, or pulling such as laundry, vacuuming, grocery shopping, and childcare. Try to arrange for help from friends and family for these activities while your back heals.  Increase physical activity slowly as tolerated.  Taking short walks is encouraged, but avoid strenuous exercise. Do not jog, run, bicycle, lift weights, or participate in any other exercises unless specifically allowed by your doctor. Avoid prolonged sitting, including car rides.  Talk to your doctor before resuming sexual activity.  You should not drive if taking pain medications as they can affect your driving  Until released by your doctor, you should not return to work or school.  You should rest at home and let your body heal.   You may shower the day after your surgery.  After showering, lightly dab your incision dry. Do not take a tub bath or go swimming for 3 weeks, or until approved by your doctor at your follow-up appointment.  If you smoke, we strongly recommend that you quit.  Smoking has been proven to interfere with normal healing in your back and will dramatically reduce the success rate of your surgery. Please contact [facility] (800-QUIT-NOW) and use the resources at www.[facility].com for assistance in stopping  smoking.  Surgical Incision   If you have a dressing on your incision, you may remove it the day after your surgery. Keep your incision area clean and dry.  If you have staples or stitches on your incision, you will have a follow up scheduled for removal  Diet            You may return to your usual diet. Be sure to stay hydrated.   Medication:  Take your regularly prescribed medications at home Take Oxycodone and Methocarbamol as needed for pain. We encourage use of Ibuprofen or Tylenol for mild pain If taking Oxycodone, take with food and a stool softener   When to Contact [place]  Although your surgery and recovery will likely be uneventful, you may have some residual numbness, aches, and pains in your back and/or legs. This is normal and should improve in the next few weeks.  However, should you experience any of the following, contact us immediately:  New numbness or weakness  Pain that is progressively getting worse, and is not relieved by your pain medications or rest  Bleeding, redness, swelling, pain, or drainage from surgical incision  Chills or flu-like symptoms  Fever greater than 101.0 F (38.3 C)  Problems with bowel or bladder functions  Difficulty breathing or shortness of breath  Warmth, tenderness, or swelling in your calf  Contact Information  During office hours (Monday-Friday 9 am to 5 pm), please call your physician at [phone]  After hours and weekends, please call the [facility] Operator at [phone] and ask for the Neurosurgery Resident On Call   For a life-threatening  emergency, call 911

## 2018-08-15 ENCOUNTER — Emergency Department
Admission: EM | Admit: 2018-08-15 | Discharge: 2018-08-15 | Disposition: A | Discharge status: Other Acute Inpatient Hospital | Payer: BLUE CROSS/BLUE SHIELD | Attending: Emergency Medicine | Admitting: Emergency Medicine

## 2018-08-15 ENCOUNTER — Other Ambulatory Visit: Payer: Self-pay

## 2018-08-15 ENCOUNTER — Encounter: Payer: Self-pay | Admitting: Emergency Medicine

## 2018-08-15 DIAGNOSIS — Z79899 Other long term (current) drug therapy: Secondary | ICD-10-CM | POA: Diagnosis not present

## 2018-08-15 DIAGNOSIS — G960 Cerebrospinal fluid leak: Secondary | ICD-10-CM | POA: Diagnosis not present

## 2018-08-15 DIAGNOSIS — Z9889 Other specified postprocedural states: Secondary | ICD-10-CM | POA: Insufficient documentation

## 2018-08-15 DIAGNOSIS — G9782 Other postprocedural complications and disorders of nervous system: Secondary | ICD-10-CM

## 2018-08-15 DIAGNOSIS — R51 Headache: Secondary | ICD-10-CM | POA: Diagnosis present

## 2018-08-15 LAB — CBC WITH DIFFERENTIAL/PLATELET
Abs Immature Granulocytes: 0.05 10*3/uL (ref 0.00–0.07)
Basophils Absolute: 0 10*3/uL (ref 0.0–0.1)
Basophils Relative: 0 %
EOS PCT: 1 %
Eosinophils Absolute: 0.1 10*3/uL (ref 0.0–0.5)
HCT: 42.6 % (ref 36.0–46.0)
Hemoglobin: 14.1 g/dL (ref 12.0–15.0)
Immature Granulocytes: 1 %
LYMPHS ABS: 2.8 10*3/uL (ref 0.7–4.0)
Lymphocytes Relative: 28 %
MCH: 30.1 pg (ref 26.0–34.0)
MCHC: 33.1 g/dL (ref 30.0–36.0)
MCV: 91 fL (ref 80.0–100.0)
Monocytes Absolute: 0.6 10*3/uL (ref 0.1–1.0)
Monocytes Relative: 6 %
NRBC: 0 % (ref 0.0–0.2)
Neutro Abs: 6.3 10*3/uL (ref 1.7–7.7)
Neutrophils Relative %: 64 %
Platelets: 325 10*3/uL (ref 150–400)
RBC: 4.68 MIL/uL (ref 3.87–5.11)
RDW: 12.1 % (ref 11.5–15.5)
WBC: 9.8 10*3/uL (ref 4.0–10.5)

## 2018-08-15 LAB — BASIC METABOLIC PANEL
Anion gap: 7 (ref 5–15)
BUN: 14 mg/dL (ref 6–20)
CO2: 27 mmol/L (ref 22–32)
Calcium: 8.9 mg/dL (ref 8.9–10.3)
Chloride: 104 mmol/L (ref 98–111)
Creatinine, Ser: 0.69 mg/dL (ref 0.44–1.00)
GFR calc Af Amer: >60 mL/min (ref >60)
GFR calc non Af Amer: >60 mL/min (ref >60)
Glucose, Bld: 84 mg/dL (ref 70–99)
Potassium: 3.9 mmol/L (ref 3.5–5.1)
Sodium: 138 mmol/L (ref 135–145)

## 2018-08-15 LAB — APTT: aPTT: 27 seconds (ref 24–36)

## 2018-08-15 LAB — PROTIME-INR
INR: 0.94
PROTHROMBIN TIME: 12.5 s (ref 11.4–15.2)

## 2018-08-15 MED ORDER — SODIUM CHLORIDE 0.9 % IV BOLUS
1000.0000 mL | Freq: Once | INTRAVENOUS | Status: AC
  Administered 2018-08-15: 1000 mL via INTRAVENOUS

## 2018-08-15 NOTE — ED Notes (Signed)
ED Provider at bedside. 

## 2018-08-15 NOTE — ED Provider Notes (Signed)
[facility] Emergency Department Provider Note  ____________________________________________  Time seen: Approximately 3:37 PM  I have reviewed the triage vital signs and the nursing notes.   HISTORY  Chief Complaint Headache    HPI Claudia Casey is a 32 y.o. female with a history of anxiety, lumbar radiculopathy, currently 5 days postop from an S1/S2 laminectomy and facetectomy by Dr. Adriana Simasook of [facility] complains of worsening headache for the past 5 days, constant, worse standing up, better lying down.  Not relieved by hydration and caffeine intake.  No fevers or chills, no neck pain or stiffness.  Denies any drainage from the site.  Reports that her left leg paresthesias that she had before the surgery are improved and she is not having any bowel or bladder retention or incontinence or leg weakness or gait instability.      Past Medical History:  Diagnosis Date  . Anxiety    occasional  . Attention deficit      Patient Active Problem List   Diagnosis Date Noted  . Lumbar radiculopathy 08/10/2018  . Anxiety      Past Surgical History:  Procedure Laterality Date  . LUMBAR LAMINECTOMY/DECOMPRESSION MICRODISCECTOMY Left 08/10/2018   Procedure: LUMBAR LAMINECTOMY/DECOMPRESSION MICRODISCECTOMY 1 LEVEL-L5-S1(HEMILAMINOTOMY,FORAMINOTOMY);  Surgeon: Lucy Chrisook, Steven, MD;  Location: [facility] ORS;  Service: Neurosurgery;  Laterality: Left;  . TONSILLECTOMY     32 yo  . TUBAL LIGATION Bilateral 02/01/2016   Procedure: POST PARTUM TUBAL LIGATION;  Surgeon: Vena AustriaAndreas Staebler, MD;  Location: [facility] ORS;  Service: Gynecology;  Laterality: Bilateral;     Prior to Admission medications   Medication Sig Start Date End Date Taking? Authorizing Provider  ADDERALL XR 25 MG 24 hr capsule Take 1 capsule by mouth daily. 06/09/18  Yes [provider]  amphetamine-dextroamphetamine (ADDERALL) 10 MG tablet Take 10 mg by mouth daily as needed (focus at 1 pm).     Yes [provider]  Butalbital-APAP-Caffeine 50-325-40 MG capsule Take 1 capsule by mouth every 4 (four) hours as needed. 08/14/18 08/24/18 Yes [provider]  gabapentin (NEURONTIN) 100 MG capsule Take 2-3 capsules by mouth 3 (three) times daily. 05/05/18  Yes [provider]  methocarbamol (ROBAXIN) 500 MG tablet Take 1 tablet (500 mg total) by mouth every 6 (six) hours as needed for muscle spasms. 08/11/18  Yes Lucy Chrisook, Steven, MD  oxyCODONE (OXY IR/ROXICODONE) 5 MG immediate release tablet Take 1 tablet (5 mg total) by mouth every 6 (six) hours as needed for moderate pain ((score 4 to 6)). 08/11/18  Yes Lucy Chrisook, Steven, MD  senna-docusate (SENOKOT-S) 8.6-50 MG tablet Take 1 tablet by mouth 2 (two) times daily. 08/11/18  Yes Lucy Chrisook, Steven, MD     Allergies Patient has no known allergies.   No family history on file.  Social History Social History   Tobacco Use  . Smoking status: Never Smoker  . Smokeless tobacco: Never Used  Substance Use Topics  . Alcohol use: No  . Drug use: No    Review of Systems  Constitutional:   No fever or chills.  ENT:   No sore throat. No rhinorrhea. Cardiovascular:   No chest pain or syncope. Respiratory:   No dyspnea or cough. Gastrointestinal:   Negative for abdominal pain, vomiting and diarrhea.  Musculoskeletal:   Postoperative low back pain, not worsening. All other systems reviewed and are negative except as documented above in ROS and HPI.  ____________________________________________   PHYSICAL EXAM:  VITAL SIGNS: ED Triage Vitals  Enc  Vitals Group     BP 08/15/18 1246 128/89     Pulse Rate 08/15/18 1246 82     Resp 08/15/18 1246 16     Temp 08/15/18 1246 98.2 F (36.8 C)     Temp Source 08/15/18 1246 Oral     SpO2 08/15/18 1246 100 %     Weight 08/15/18 1247 154 lb (69.9 kg)     Height 08/15/18 1247 5\' 2"  (1.575 m)     Head Circumference --      Peak Flow --      Pain Score 08/15/18 1247 10     Pain Loc  --      Pain Edu? --      Excl. in GC? --     Vital signs reviewed, nursing assessments reviewed.   Constitutional:   Alert and oriented. Non-toxic appearance. Eyes:   Conjunctivae are normal. EOMI.  ENT      Head:   Normocephalic and atraumatic.      Nose:   No congestion/rhinnorhea.       Mouth/Throat:   MMM, no pharyngeal erythema. No peritonsillar mass.       Neck:   No meningismus. Full ROM. Hematological/Lymphatic/Immunilogical:   No cervical lymphadenopathy. Cardiovascular:   RRR. Symmetric bilateral radial and DP pulses.  No murmurs. Cap refill less than 2 seconds. Respiratory:   Normal respiratory effort without tachypnea/retractions. Breath sounds are clear and equal bilaterally. No wheezes/rales/rhonchi. Gastrointestinal:   Soft and nontender. Non distended. There is no CVA tenderness.  No rebound, rigidity, or guarding.  Musculoskeletal:   Normal range of motion in all extremities. No joint effusions.  No lower extremity tenderness.  No edema.  No midline spinal tenderness.  Surgical incision site is closed, healing appropriately without soft tissue swelling or palpable mass/fluid collection Neurologic:   Normal speech and language.  Motor grossly intact. No acute focal neurologic deficits are appreciated.  Skin:    Skin is warm, dry and intact. No rash noted.  No petechiae, purpura, or bullae.  ____________________________________________    LABS (pertinent positives/negatives) (all labs ordered are listed, but only abnormal results are displayed) Labs Reviewed  BASIC METABOLIC PANEL  PROTIME-INR  APTT  CBC WITH DIFFERENTIAL/PLATELET   ____________________________________________   EKG    ____________________________________________    RADIOLOGY  No results found.  ____________________________________________   PROCEDURES Procedures  ____________________________________________    CLINICAL IMPRESSION / ASSESSMENT AND PLAN / ED  COURSE  Pertinent labs & imaging results that were available during my care of the patient were reviewed by me and considered in my medical decision making (see chart for details).    Patient presents with worsening positional headache consistent with CSF leak.  Vital signs are normal.  On exam there is no sign of meningitis or epidural hematoma or epidural abscess.  No cauda equina symptoms.  Case discussed with Dr. Teola BradleyBarr of [facility].  I will give IV saline bolus for hydration and check basic labs.  Patient agreeable to transfer.      ____________________________________________   FINAL CLINICAL IMPRESSION(S) / ED DIAGNOSES    Final diagnoses:  Postoperative CSF leak  Positional headache   ED Discharge Orders    None      Portions of this note were generated with dragon dictation software. Dictation errors may occur despite best attempts at proofreading.   Sharman CheekStafford, Ary Lavine, MD 08/15/18 [id number]

## 2018-08-15 NOTE — ED Triage Notes (Signed)
Pt to ED via POV, pt had back surgery on Monday and per pts mother they nicked the spinal fluid. Pt has had headache, nausea, dizziness since then. Pt states that her head feels like it is going to explode. Charge RN called and pt taken to treatment room.

## 2018-08-15 NOTE — ED Notes (Signed)
Updated pt that we are waiting for Dr. Scotty CourtStafford to speak with neurosurgery re: plan of care, pt states she is more comfortable lying down and the pain and sxs are manageable. Pt states it is worse when she gets up and walks or sits up.

## 2018-08-15 NOTE — ED Triage Notes (Signed)
First Nurse Note:  States had back surgery on Monday, arrives today c/o dizziness.    Patient is AAOx3.  Skin warm and dry. Ambulates with easy and steady gait.  Posture upright and relaxed. MAE equally and strong.  NAD

## 2018-08-15 NOTE — ED Notes (Signed)
Pt ambulatory to bathroom at this time, waiting for Dr. Scotty CourtStafford to talk to pt's surgeon.

## 2018-08-15 NOTE — ED Notes (Signed)
Per Dr. Scotty CourtStafford, [facility].

## 2018-08-16 MED ORDER — SODIUM CHLORIDE 0.9 % IV SOLN
INTRAVENOUS | Status: DC
Start: ? — End: 2018-08-16

## 2018-08-16 MED ORDER — TRAMADOL HCL 50 MG PO TABS
50.00 | ORAL_TABLET | ORAL | Status: DC
Start: ? — End: 2018-08-16

## 2018-08-16 MED ORDER — LIDOCAINE HCL (PF) 1 % IJ SOLN
0.50 | INTRAMUSCULAR | Status: DC
Start: ? — End: 2018-08-16

## 2018-08-16 MED ORDER — BUTALBITAL-APAP-CAFFEINE 50-325-40 MG PO TABS
1.00 | ORAL_TABLET | ORAL | Status: DC
Start: ? — End: 2018-08-16

## 2018-08-16 MED ORDER — SODIUM CHLORIDE FLUSH 0.9 % IV SOLN
5.00 | INTRAVENOUS | Status: DC
Start: 2018-08-16 — End: 2018-08-16

## 2018-08-16 MED ORDER — GABAPENTIN 300 MG PO CAPS
300.00 | ORAL_CAPSULE | ORAL | Status: DC
Start: 2018-08-16 — End: 2018-08-16

## 2018-08-21 NOTE — Anesthesia Postprocedure Evaluation (Signed)
Anesthesia Post Note  Patient: Claudia Casey  Procedure(s) Performed: LUMBAR LAMINECTOMY/DECOMPRESSION MICRODISCECTOMY 1 LEVEL-L5-S1(HEMILAMINOTOMY,FORAMINOTOMY) (Left )  Patient location during evaluation: PACU Anesthesia Type: General Level of consciousness: awake and alert Pain management: pain level controlled Vital Signs Assessment: post-procedure vital signs reviewed and stable Respiratory status: spontaneous breathing, nonlabored ventilation, respiratory function stable and patient connected to nasal cannula oxygen Cardiovascular status: blood pressure returned to baseline and stable Postop Assessment: no apparent nausea or vomiting Anesthetic complications: no     Last Vitals:  Vitals:   08/10/18 2324 08/11/18 0353  BP: (!) 101/48 (!) 99/47  Pulse: 78 (!) 52  Resp: 19 19  Temp: 37 C 36.9 C  SpO2: 98% 93%    Last Pain:  Vitals:   08/11/18 0805  TempSrc:   PainSc: 7                  Yevette Edwards

## 2018-09-17 ENCOUNTER — Other Ambulatory Visit (HOSPITAL_COMMUNITY): Payer: Self-pay | Admitting: Neurosurgery

## 2018-09-17 ENCOUNTER — Other Ambulatory Visit: Payer: Self-pay | Admitting: Neurosurgery

## 2018-09-17 DIAGNOSIS — G44321 Chronic post-traumatic headache, intractable: Secondary | ICD-10-CM

## 2018-09-27 ENCOUNTER — Ambulatory Visit: Admission: RE | Admit: 2018-09-27 | Payer: PRIVATE HEALTH INSURANCE | Source: Ambulatory Visit

## 2018-10-27 ENCOUNTER — Ambulatory Visit: Admission: RE | Admit: 2018-10-27 | Payer: BC Managed Care – PPO | Source: Ambulatory Visit

## 2019-07-20 ENCOUNTER — Other Ambulatory Visit: Payer: Self-pay

## 2019-07-20 DIAGNOSIS — Z20822 Contact with and (suspected) exposure to covid-19: Secondary | ICD-10-CM

## 2019-07-22 LAB — NOVEL CORONAVIRUS, NAA: SARS-CoV-2, NAA: NOT DETECTED

## 2019-10-04 ENCOUNTER — Other Ambulatory Visit: Payer: Self-pay

## 2019-10-04 ENCOUNTER — Ambulatory Visit (INDEPENDENT_AMBULATORY_CARE_PROVIDER_SITE_OTHER): Payer: PRIVATE HEALTH INSURANCE | Admitting: Obstetrics and Gynecology

## 2019-10-04 ENCOUNTER — Encounter: Payer: Self-pay | Admitting: Obstetrics and Gynecology

## 2019-10-04 VITALS — BP 110/80 | Ht 62.0 in | Wt 168.0 lb

## 2019-10-04 DIAGNOSIS — Z01419 Encounter for gynecological examination (general) (routine) without abnormal findings: Secondary | ICD-10-CM

## 2019-10-04 NOTE — Progress Notes (Signed)
PCP:  Ellene Route   Chief Complaint  Patient presents with  . Gynecologic Exam     HPI:      [name] is a 34 y.o. G3P1003 who LMP was Patient's last menstrual period was 09/03/2019 (approximate)., presents today for her annual examination.  Her menses are regular every 28-30 days, lasting 4 days.  Dysmenorrhea none. She does not have intermenstrual bleeding.  Sex activity: single partner, contraception - tubal ligation. Has decreased libido since last pregnancy.  Last Pap: April 22, 2017  Results were: no abnormalities /neg HPV DNA   There is no FH of breast cancer. There is no FH of ovarian cancer. The patient does do self-breast exams.  Tobacco use: The patient denies current or previous tobacco use. Alcohol use: none No drug use.  Exercise: moderately active  She does get adequate calcium and Vitamin D in her diet.   Past Medical History:  Diagnosis Date  . Anxiety    occasional  . Attention deficit     Past Surgical History:  Procedure Laterality Date  . LUMBAR LAMINECTOMY/DECOMPRESSION MICRODISCECTOMY Left 08/10/2018   Procedure: LUMBAR LAMINECTOMY/DECOMPRESSION MICRODISCECTOMY 1 LEVEL-L5-S1(HEMILAMINOTOMY,FORAMINOTOMY);  Surgeon: Deetta Perla, MD;  Location: [facility];  Service: Neurosurgery;  Laterality: Left;  . TONSILLECTOMY     34 yo  . TUBAL LIGATION Bilateral 02/01/2016   Procedure: POST PARTUM TUBAL LIGATION;  Surgeon: Malachy Mood, MD;  Location: [facility];  Service: Gynecology;  Laterality: Bilateral;    History reviewed. No pertinent family history.  Social History   Socioeconomic History  . Marital status: Legally Separated    Spouse name: Not on file  . Number of children: Not on file  . Years of education: Not on file  . Highest education level: Not on file  Occupational History  . Not on file  Tobacco Use  . Smoking status: Never Smoker  . Smokeless tobacco: Never Used  Substance and Sexual Activity  .  Alcohol use: No  . Drug use: No  . Sexual activity: Yes    Birth control/protection: Surgical    Comment: Tubal ligation  Other Topics Concern  . Not on file  Social History Narrative  . Not on file   Social Determinants of Health   Financial Resource Strain:   . Difficulty of Paying Living Expenses: Not on file  Food Insecurity:   . Worried About Charity fundraiser in the Last Year: Not on file  . Ran Out of Food in the Last Year: Not on file  Transportation Needs:   . Lack of Transportation (Medical): Not on file  . Lack of Transportation (Non-Medical): Not on file  Physical Activity:   . Days of Exercise per Week: Not on file  . Minutes of Exercise per Session: Not on file  Stress:   . Feeling of Stress : Not on file  Social Connections:   . Frequency of Communication with Friends and Family: Not on file  . Frequency of Social Gatherings with Friends and Family: Not on file  . Attends Religious Services: Not on file  . Active Member of Clubs or Organizations: Not on file  . Attends Archivist Meetings: Not on file  . Marital Status: Not on file  Intimate Partner Violence:   . Fear of Current or Ex-Partner: Not on file  . Emotionally Abused: Not on file  . Physically Abused: Not on file  . Sexually Abused: Not on file     Current Outpatient  Medications:  .  amphetamine-dextroamphetamine (ADDERALL XR) 25 MG 24 hr capsule, Take by mouth., Disp: , Rfl:  .  amphetamine-dextroamphetamine (ADDERALL) 10 MG tablet, Take by mouth., Disp: , Rfl:  .  valACYclovir (VALTREX) 1000 MG tablet, 2 po ,repeat 12 hrs, Disp: , Rfl:      ROS:  Review of Systems  Constitutional: Negative for fatigue, fever and unexpected weight change.  Respiratory: Negative for cough, shortness of breath and wheezing.   Cardiovascular: Negative for chest pain, palpitations and leg swelling.  Gastrointestinal: Negative for blood in stool, constipation, diarrhea, nausea and vomiting.    Endocrine: Negative for cold intolerance, heat intolerance and polyuria.  Genitourinary: Negative for dyspareunia, dysuria, flank pain, frequency, genital sores, hematuria, menstrual problem, pelvic pain, urgency, vaginal bleeding, vaginal discharge and vaginal pain.  Musculoskeletal: Negative for back pain, joint swelling and myalgias.  Skin: Negative for rash.  Neurological: Negative for dizziness, syncope, light-headedness, numbness and headaches.  Hematological: Negative for adenopathy.  Psychiatric/Behavioral: Negative for agitation, confusion, sleep disturbance and suicidal ideas. The patient is not nervous/anxious.   BREAST: No symptoms   Objective: BP 110/80   Ht 5\' 2"  (1.575 m)   Wt 168 lb (76.2 kg)   LMP 09/03/2019 (Approximate)   Breastfeeding No   BMI 30.73 kg/m    Physical Exam Constitutional:      Appearance: She is well-developed.  Genitourinary:     Vulva, vagina, cervix, uterus, right adnexa and left adnexa normal.     No vulval lesion or tenderness noted.     No vaginal discharge, erythema or tenderness.     No cervical polyp.     Uterus is not enlarged or tender.     No right or left adnexal mass present.     Right adnexa not tender.     Left adnexa not tender.  Neck:     Thyroid: No thyromegaly.  Cardiovascular:     Rate and Rhythm: Normal rate and regular rhythm.     Heart sounds: Normal heart sounds. No murmur.  Pulmonary:     Effort: Pulmonary effort is normal.     Breath sounds: Normal breath sounds.  Chest:     Breasts:        Right: No mass, nipple discharge, skin change or tenderness.        Left: No mass, nipple discharge, skin change or tenderness.  Abdominal:     Palpations: Abdomen is soft.     Tenderness: There is no abdominal tenderness. There is no guarding.  Musculoskeletal:        General: Normal range of motion.     Cervical back: Normal range of motion.  Neurological:     General: No focal deficit present.     Mental Status:  She is alert and oriented to person, place, and time.     Cranial Nerves: No cranial nerve deficit.  Skin:    General: Skin is warm and dry.  Psychiatric:        Mood and Affect: Mood normal.        Behavior: Behavior normal.        Thought Content: Thought content normal.        Judgment: Judgment normal.  Vitals reviewed.     Assessment/Plan: Encounter for annual routine gynecological examination            GYN counsel adequate intake of calcium and vitamin D, diet and exercise     F/U  Return in about 1 year (  around 10/03/2020).  Sireen Halk B. Marchia Diguglielmo, PA-C 10/04/2019 1:47 PM

## 2019-10-04 NOTE — Patient Instructions (Signed)
I value your feedback and entrusting us with your care. If you get a  patient survey, I would appreciate you taking the time to let us know about your experience today. Thank you!  As of July 29, 2019, your lab results will be released to your MyChart immediately, before I even have a chance to see them. Please give me time to review them and contact you if there are any abnormalities. Thank you for your patience.  

## 2020-05-11 ENCOUNTER — Other Ambulatory Visit: Payer: Self-pay | Admitting: Family Medicine

## 2020-05-11 DIAGNOSIS — R202 Paresthesia of skin: Secondary | ICD-10-CM

## 2020-05-11 DIAGNOSIS — M545 Low back pain, unspecified: Secondary | ICD-10-CM

## 2020-05-11 DIAGNOSIS — M5416 Radiculopathy, lumbar region: Secondary | ICD-10-CM

## 2020-05-29 ENCOUNTER — Ambulatory Visit
Admission: RE | Admit: 2020-05-29 | Discharge: 2020-05-29 | Disposition: A | Discharge status: Home / Self Care | Payer: PRIVATE HEALTH INSURANCE | Source: Ambulatory Visit | Attending: Family Medicine | Admitting: Family Medicine

## 2020-05-29 ENCOUNTER — Other Ambulatory Visit: Payer: Self-pay

## 2020-05-29 ENCOUNTER — Ambulatory Visit: Payer: PRIVATE HEALTH INSURANCE

## 2020-05-29 DIAGNOSIS — M5416 Radiculopathy, lumbar region: Secondary | ICD-10-CM | POA: Insufficient documentation

## 2020-05-29 DIAGNOSIS — R202 Paresthesia of skin: Secondary | ICD-10-CM | POA: Insufficient documentation

## 2020-05-29 DIAGNOSIS — M545 Low back pain, unspecified: Secondary | ICD-10-CM | POA: Insufficient documentation

## 2020-06-08 ENCOUNTER — Other Ambulatory Visit: Payer: Self-pay

## 2021-03-08 IMAGING — MR MR LUMBAR SPINE W/O CM
4 of 5 series · 33 of 48 positions shown · non-contrast
Comparison: Radiography 08/10/2018.  MRI 03/20/2018.

CLINICAL DATA: Lumbar surgery 0529. Right toe numbness. Left
buttock and anterior thigh pain.

EXAM:
MRI LUMBAR SPINE WITHOUT CONTRAST
TECHNIQUE: Multiplanar, multisequence MR imaging of the lumbar spine was
performed. No intravenous contrast was administered.

[Series 5: T2 · sagittal · 4.0mm · 0.81mm/px · 8 of 17 slices shown (1 of 2)]
[im 1/17]
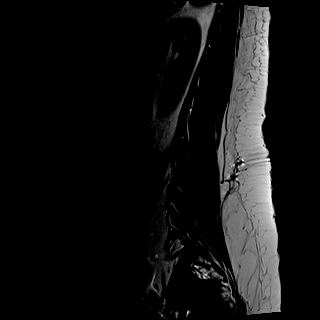
[im 3/17]
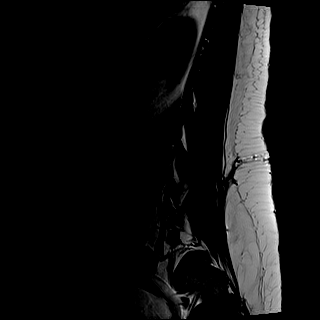
[im 5/17]
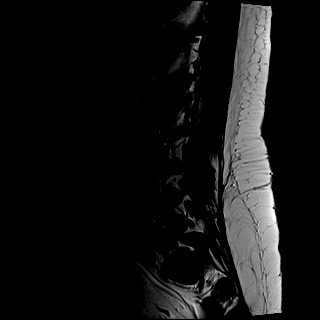
[im 7/17]
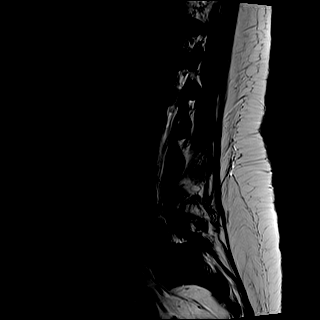
[im 10/17]
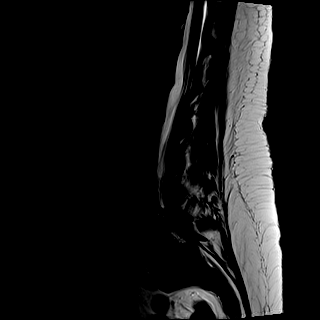
[im 12/17]
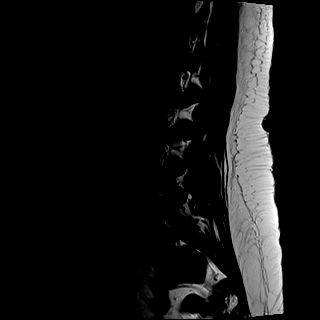
[im 14/17]
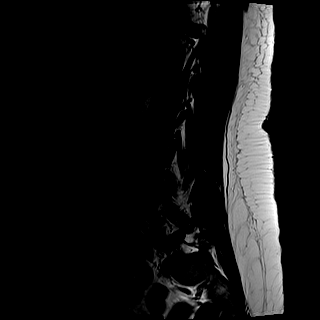
[im 17/17]
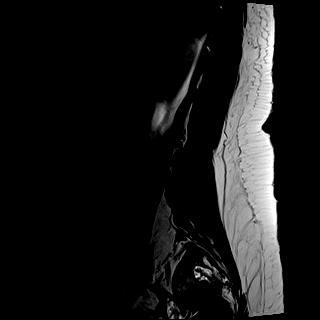

[Series 6: T1 · sagittal · 4.0mm · 0.81mm/px · 7 of 17 slices shown (1 of 2)]
[im 1/17]
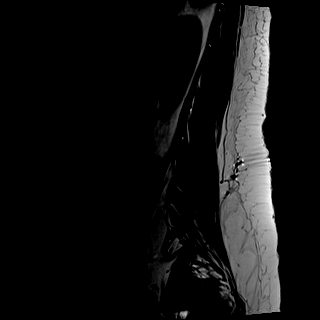
[im 3/17]
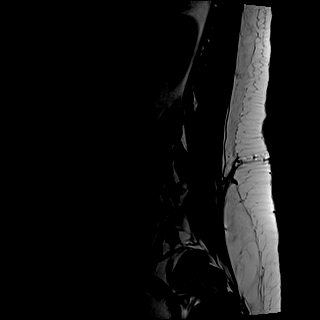
[im 6/17]
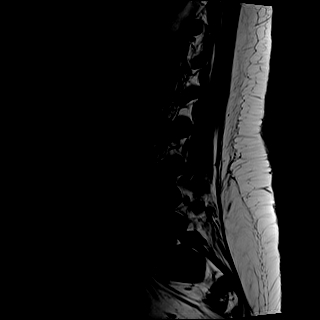
[im 9/17]
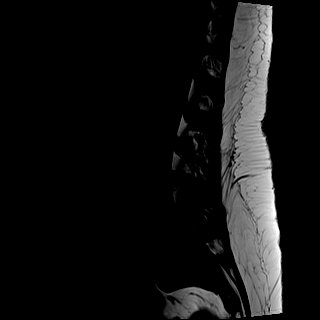
[im 11/17]
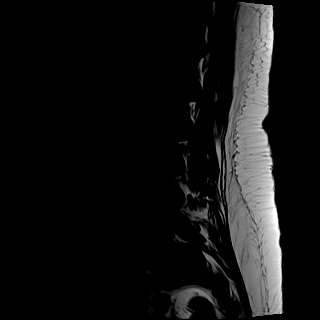
[im 14/17]
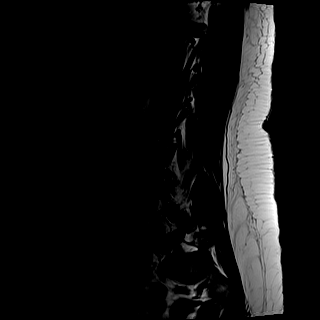
[im 17/17]
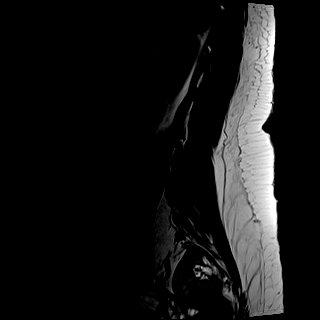

[Series 8: T2 · axial · 4.0mm · 0.78mm/px · z∈[-106,+108]mm · 9 of 32 slices shown (2 of 2)]
[im 1/32]
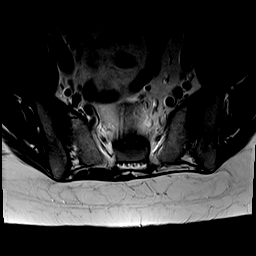
[im 6/32]
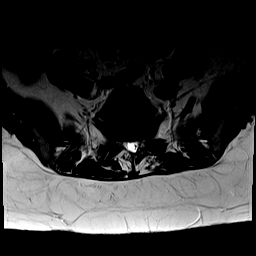
[im 11/32]
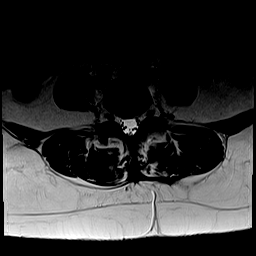
[im 13/32]
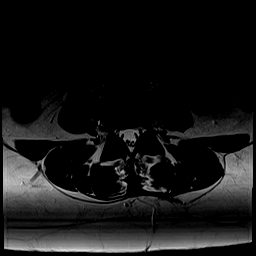
[im 16/32]
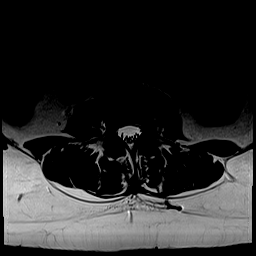
[im 19/32]
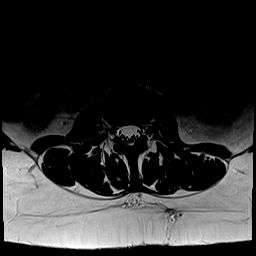
[im 21/32]
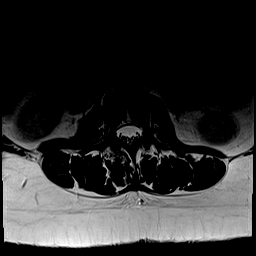
[im 26/32]
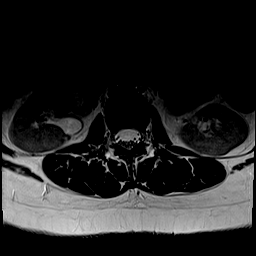
[im 32/32]
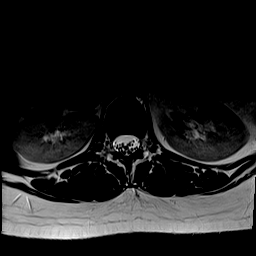

[Series 9: T1 · axial · 4.0mm · 0.39mm/px · z∈[-106,+108]mm · 9 of 32 slices shown (2 of 2)]
[im 1/32]
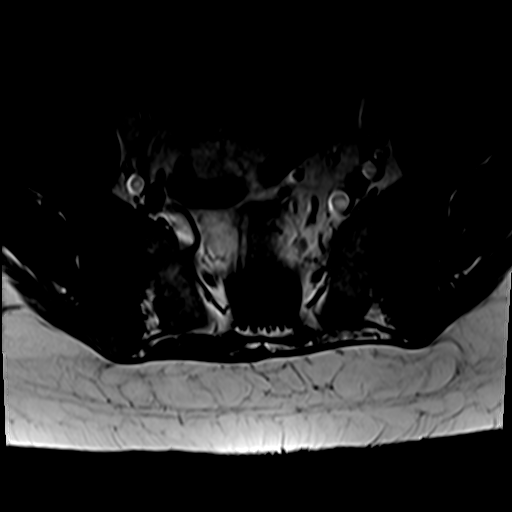
[im 6/32]
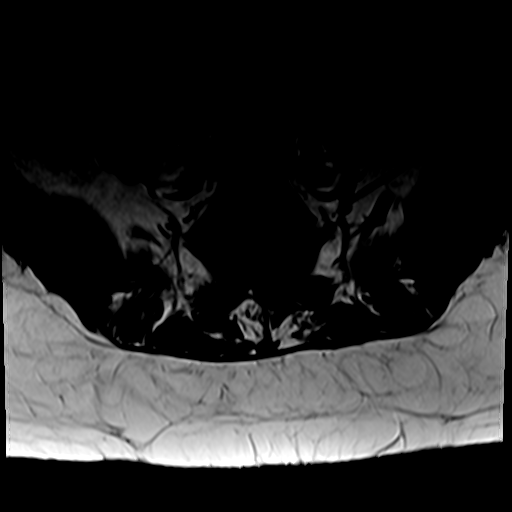
[im 11/32]
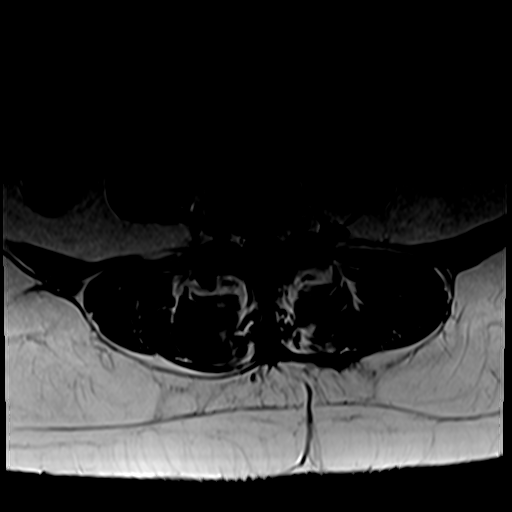
[im 13/32]
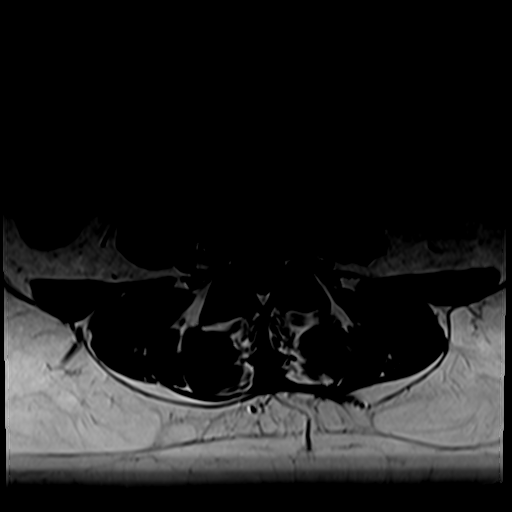
[im 16/32]
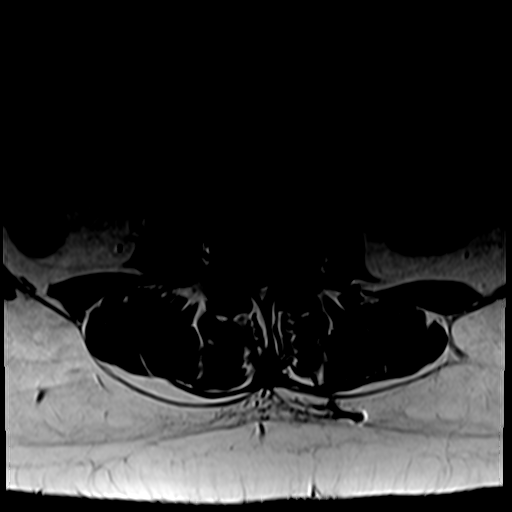
[im 19/32]
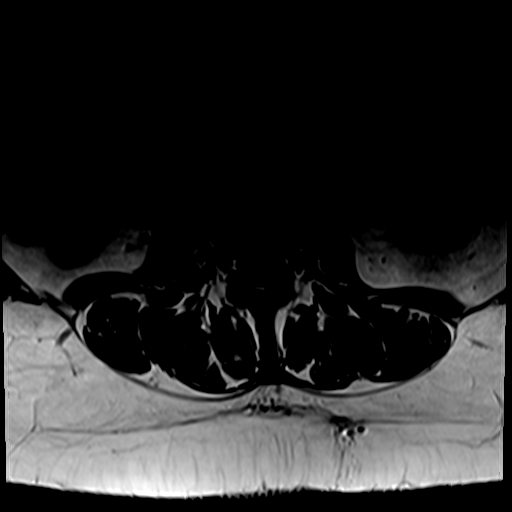
[im 21/32]
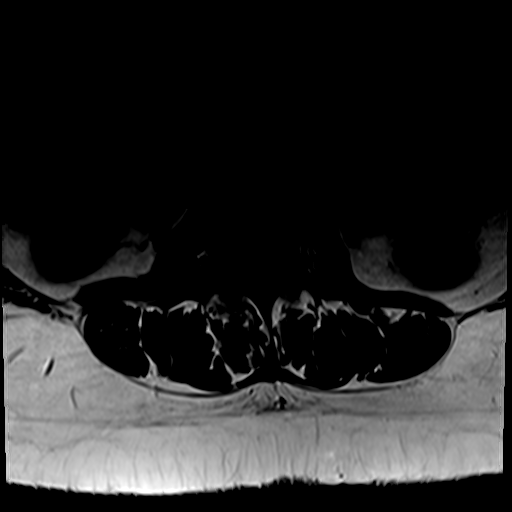
[im 26/32]
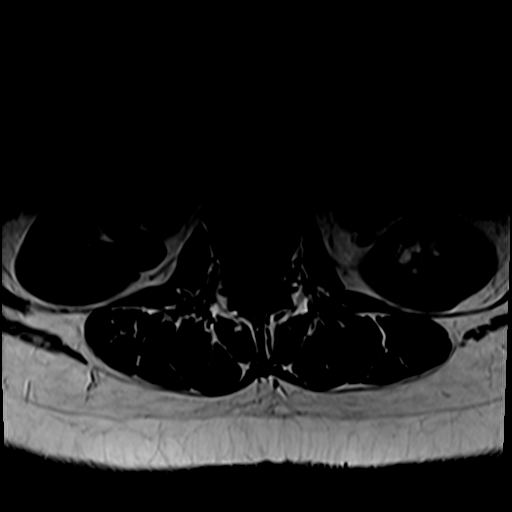
[im 32/32]
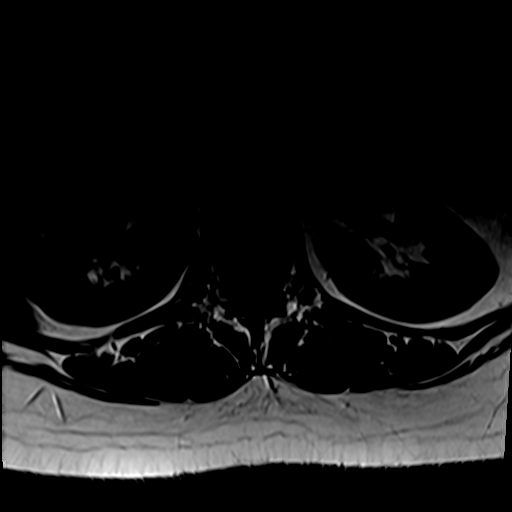

[33 of 48 positions shown; findings below may reference images not displayed]

FINDINGS: Segmentation: Same numbering terminology used as on the previous
exam. S1 is a transitional vertebra.

Alignment:  Exaggerated lumbosacral lordosis.

Vertebrae: Chronic endplate irregularity of the endplates at the
S1-2 level as seen previously. No other bone finding.

Conus medullaris and cauda equina: Conus extends to the L1 level.
Conus and cauda equina appear normal.

Paraspinal and other soft tissues: Negative

Disc levels:

No abnormality at L3-4 or above.

L4-5: Minimal facet and ligamentous prominence. No disc abnormality.
No stenosis.

L5-S1: Mild degeneration and bulging of the disc. Facet degeneration
with facet and ligamentous hypertrophy. No compressive stenosis.
Similar appearance to previous study. Findings at this level could
contribute to low back pain.

S1-2: Transitional level. Chronic disc degeneration chronic endplate
irregularity particularly at the superior endplate of S2. Disc
degeneration with annular bulging, similar to the previous study.
Encroachment upon the sacral spinal canal, more on the left side.
Facets at this level shows some degeneration and hypertrophy. Nerve
root compression could occur, particularly on the left.
IMPRESSION: 1. S1 is a transitional vertebra.
2. L5-S1: Mild disc bulge. Facet and ligamentous hypertrophy. No
compressive stenosis. Findings at this level could contribute to low
back pain.
3. S1-2: Chronic disc degeneration with annular bulging. Chronic
endplate irregularity particularly at the superior endplate of S2.
Bilateral facet degeneration and hypertrophy. Narrowing of the
sacral canal, more on the left. Nerve root compression could occur,
particularly on the left.
4. No change can be demonstrated compared to the study of 0529.

## 2021-07-24 ENCOUNTER — Ambulatory Visit (INDEPENDENT_AMBULATORY_CARE_PROVIDER_SITE_OTHER): Payer: No Typology Code available for payment source | Admitting: Obstetrics and Gynecology

## 2021-07-24 ENCOUNTER — Encounter: Payer: Self-pay | Admitting: Obstetrics and Gynecology

## 2021-07-24 ENCOUNTER — Other Ambulatory Visit: Payer: Self-pay

## 2021-07-24 ENCOUNTER — Other Ambulatory Visit (HOSPITAL_COMMUNITY)
Admission: RE | Admit: 2021-07-24 | Discharge: 2021-07-24 | Disposition: A | Discharge status: Home / Self Care | Payer: No Typology Code available for payment source | Source: Ambulatory Visit | Attending: Obstetrics and Gynecology | Admitting: Obstetrics and Gynecology

## 2021-07-24 VITALS — BP 120/80 | Ht 63.0 in | Wt 200.0 lb

## 2021-07-24 DIAGNOSIS — Z124 Encounter for screening for malignant neoplasm of cervix: Secondary | ICD-10-CM | POA: Insufficient documentation

## 2021-07-24 DIAGNOSIS — Z01419 Encounter for gynecological examination (general) (routine) without abnormal findings: Secondary | ICD-10-CM

## 2021-07-24 DIAGNOSIS — Z1151 Encounter for screening for human papillomavirus (HPV): Secondary | ICD-10-CM | POA: Insufficient documentation

## 2021-07-24 NOTE — Patient Instructions (Signed)
I value your feedback and you entrusting us with your care. If you get a [facility] patient survey, I would appreciate you taking the time to let us know about your experience today. Thank you! ? ? ?

## 2021-07-24 NOTE — Progress Notes (Signed)
PCP:  Ellene Route   Chief Complaint  Patient presents with   Gynecologic Exam    No concerns     HPI:      [name] is a 35 y.o. G3P1003 who LMP was Patient's last menstrual period was 07/09/2021 (approximate)., presents today for her annual examination.  Her menses are regular every 4-5 wks, lasting 5 days.  Dysmenorrhea none, occas has BTB.  Sex activity: single partner, contraception - tubal ligation.  Last Pap: April 22, 2017  Results were: no abnormalities /neg HPV DNA   There is no FH of breast cancer. There is no FH of ovarian cancer. The patient does do self-breast exams.  Tobacco use: The patient denies current or previous tobacco use. Alcohol use: none No drug use.  Exercise: moderately active  She does get adequate calcium but not Vitamin D in her diet.   Past Medical History:  Diagnosis Date   Anxiety    occasional   Attention deficit     Past Surgical History:  Procedure Laterality Date   LUMBAR LAMINECTOMY/DECOMPRESSION MICRODISCECTOMY Left 08/10/2018   Procedure: LUMBAR LAMINECTOMY/DECOMPRESSION MICRODISCECTOMY 1 LEVEL-L5-S1(HEMILAMINOTOMY,FORAMINOTOMY);  Surgeon: Deetta Perla, MD;  Location: [facility];  Service: Neurosurgery;  Laterality: Left;   TONSILLECTOMY     35 yo   TUBAL LIGATION Bilateral 02/01/2016   Procedure: POST PARTUM TUBAL LIGATION;  Surgeon: Malachy Mood, MD;  Location: [facility];  Service: Gynecology;  Laterality: Bilateral;    History reviewed. No pertinent family history.  Social History   Socioeconomic History   Marital status: Legally Separated    Spouse name: Not on file   Number of children: Not on file   Years of education: Not on file   Highest education level: Not on file  Occupational History   Not on file  Tobacco Use   Smoking status: Never   Smokeless tobacco: Never  Vaping Use   Vaping Use: Never used  Substance and Sexual Activity   Alcohol use: No   Drug use: No   Sexual  activity: Yes    Birth control/protection: Surgical    Comment: Tubal ligation  Other Topics Concern   Not on file  Social History Narrative   Not on file   Social Determinants of Health   Financial Resource Strain: Not on file  Food Insecurity: Not on file  Transportation Needs: Not on file  Physical Activity: Not on file  Stress: Not on file  Social Connections: Not on file  Intimate Partner Violence: Not on file     Current Outpatient Medications:    valACYclovir (VALTREX) 1000 MG tablet, 2 po ,repeat 12 hrs, Disp: , Rfl:    amphetamine-dextroamphetamine (ADDERALL XR) 25 MG 24 hr capsule, Take by mouth. (Patient not taking: Reported on 07/24/2021), Disp: , Rfl:    amphetamine-dextroamphetamine (ADDERALL) 10 MG tablet, Take by mouth. (Patient not taking: Reported on 07/24/2021), Disp: , Rfl:    phentermine (ADIPEX-P) 37.5 MG tablet, Take 37.5 mg by mouth every morning. (Patient not taking: Reported on 07/24/2021), Disp: , Rfl:      ROS:  Review of Systems  Constitutional:  Negative for fatigue, fever and unexpected weight change.  Respiratory:  Negative for cough, shortness of breath and wheezing.   Cardiovascular:  Negative for chest pain, palpitations and leg swelling.  Gastrointestinal:  Negative for blood in stool, constipation, diarrhea, nausea and vomiting.  Endocrine: Negative for cold intolerance, heat intolerance and polyuria.  Genitourinary:  Negative for dyspareunia, dysuria, flank  pain, frequency, genital sores, hematuria, menstrual problem, pelvic pain, urgency, vaginal bleeding, vaginal discharge and vaginal pain.  Musculoskeletal:  Negative for back pain, joint swelling and myalgias.  Skin:  Negative for rash.  Neurological:  Negative for dizziness, syncope, light-headedness, numbness and headaches.  Hematological:  Negative for adenopathy.  Psychiatric/Behavioral:  Negative for agitation, confusion, sleep disturbance and suicidal ideas. The patient is not  nervous/anxious.  BREAST: No symptoms   Objective: BP 120/80   Ht 5\' 3"  (1.6 m)   Wt 200 lb (90.7 kg)   LMP 07/09/2021 (Approximate)   BMI 35.43 kg/m    Physical Exam Constitutional:      Appearance: She is well-developed.  Genitourinary:     Vulva normal.     Right Labia: No rash, tenderness or lesions.    Left Labia: No tenderness, lesions or rash.    No vaginal discharge, erythema or tenderness.      Right Adnexa: not tender and no mass present.    Left Adnexa: not tender and no mass present.    No cervical friability or polyp.     Uterus is not enlarged or tender.  Breasts:    Right: No mass, nipple discharge, skin change or tenderness.     Left: No mass, nipple discharge, skin change or tenderness.  Neck:     Thyroid: No thyromegaly.  Cardiovascular:     Rate and Rhythm: Normal rate and regular rhythm.     Heart sounds: Normal heart sounds. No murmur heard. Pulmonary:     Effort: Pulmonary effort is normal.     Breath sounds: Normal breath sounds.  Abdominal:     Palpations: Abdomen is soft.     Tenderness: There is no abdominal tenderness. There is no guarding or rebound.  Musculoskeletal:        General: Normal range of motion.     Cervical back: Normal range of motion.  Lymphadenopathy:     Cervical: No cervical adenopathy.  Neurological:     General: No focal deficit present.     Mental Status: She is alert and oriented to person, place, and time.     Cranial Nerves: No cranial nerve deficit.  Skin:    General: Skin is warm and dry.  Psychiatric:        Mood and Affect: Mood normal.        Behavior: Behavior normal.        Thought Content: Thought content normal.        Judgment: Judgment normal.  Vitals reviewed.    Assessment/Plan: Encounter for annual routine gynecological examination  Cervical cancer screening - Plan: Cytology - PAP  Screening for HPV (human papillomavirus) - Plan: Cytology - PAP            GYN counsel adequate intake of  calcium and vitamin D, diet and exercise     F/U  Return in about 1 year (around 07/24/2022).  Toren Tucholski B. Roya Gieselman, PA-C 07/24/2021 4:34 PM

## 2021-07-26 LAB — CYTOLOGY - PAP
Comment: NEGATIVE
Diagnosis: NEGATIVE
High risk HPV: NEGATIVE

## 2022-08-14 NOTE — Progress Notes (Unsigned)
PCP:  Nira Retort   No chief complaint on file.    HPI:      [name] is a 36 y.o. G3P1003 who LMP was No LMP recorded., presents today for her annual examination.  Her menses are regular every 4-5 wks, lasting 5 days.  Dysmenorrhea none, occas has BTB.  Sex activity: single partner, contraception - tubal ligation.  Last Pap: 07/24/21 Results were: no abnormalities /neg HPV DNA   There is no FH of breast cancer. There is no FH of ovarian cancer. The patient does do self-breast exams.  Tobacco use: The patient denies current or previous tobacco use. Alcohol use: none No drug use.  Exercise: moderately active  She does get adequate calcium but not Vitamin D in her diet.   Past Medical History:  Diagnosis Date   Anxiety    occasional   Attention deficit     Past Surgical History:  Procedure Laterality Date   LUMBAR LAMINECTOMY/DECOMPRESSION MICRODISCECTOMY Left 08/10/2018   Procedure: LUMBAR LAMINECTOMY/DECOMPRESSION MICRODISCECTOMY 1 LEVEL-L5-S1(HEMILAMINOTOMY,FORAMINOTOMY);  Surgeon: Lucy Chris, MD;  Location: [facility];  Service: Neurosurgery;  Laterality: Left;   TONSILLECTOMY     36 yo   TUBAL LIGATION Bilateral 02/01/2016   Procedure: POST PARTUM TUBAL LIGATION;  Surgeon: Vena Austria, MD;  Location: [facility];  Service: Gynecology;  Laterality: Bilateral;    No family history on file.  Social History   Socioeconomic History   Marital status: Married    Spouse name: Not on file   Number of children: Not on file   Years of education: Not on file   Highest education level: Not on file  Occupational History   Not on file  Tobacco Use   Smoking status: Never   Smokeless tobacco: Never  Vaping Use   Vaping Use: Never used  Substance and Sexual Activity   Alcohol use: No   Drug use: No   Sexual activity: Yes    Birth control/protection: Surgical    Comment: Tubal ligation  Other Topics Concern   Not on file  Social History  Narrative   Not on file   Social Determinants of Health   Financial Resource Strain: Not on file  Food Insecurity: Not on file  Transportation Needs: Not on file  Physical Activity: Not on file  Stress: Not on file  Social Connections: Not on file  Intimate Partner Violence: Not on file     Current Outpatient Medications:    amphetamine-dextroamphetamine (ADDERALL XR) 25 MG 24 hr capsule, Take by mouth. (Patient not taking: Reported on 07/24/2021), Disp: , Rfl:    amphetamine-dextroamphetamine (ADDERALL) 10 MG tablet, Take by mouth. (Patient not taking: Reported on 07/24/2021), Disp: , Rfl:    phentermine (ADIPEX-P) 37.5 MG tablet, Take 37.5 mg by mouth every morning. (Patient not taking: Reported on 07/24/2021), Disp: , Rfl:    valACYclovir (VALTREX) 1000 MG tablet, 2 po ,repeat 12 hrs, Disp: , Rfl:      ROS:  Review of Systems  Constitutional:  Negative for fatigue, fever and unexpected weight change.  Respiratory:  Negative for cough, shortness of breath and wheezing.   Cardiovascular:  Negative for chest pain, palpitations and leg swelling.  Gastrointestinal:  Negative for blood in stool, constipation, diarrhea, nausea and vomiting.  Endocrine: Negative for cold intolerance, heat intolerance and polyuria.  Genitourinary:  Negative for dyspareunia, dysuria, flank pain, frequency, genital sores, hematuria, menstrual problem, pelvic pain, urgency, vaginal bleeding, vaginal discharge and vaginal pain.  Musculoskeletal:  Negative for back pain, joint swelling and myalgias.  Skin:  Negative for rash.  Neurological:  Negative for dizziness, syncope, light-headedness, numbness and headaches.  Hematological:  Negative for adenopathy.  Psychiatric/Behavioral:  Negative for agitation, confusion, sleep disturbance and suicidal ideas. The patient is not nervous/anxious.   BREAST: No symptoms   Objective: There were no vitals taken for this visit.   Physical Exam Constitutional:       Appearance: She is well-developed.  Genitourinary:     Vulva normal.     Right Labia: No rash, tenderness or lesions.    Left Labia: No tenderness, lesions or rash.    No vaginal discharge, erythema or tenderness.      Right Adnexa: not tender and no mass present.    Left Adnexa: not tender and no mass present.    No cervical friability or polyp.     Uterus is not enlarged or tender.  Breasts:    Right: No mass, nipple discharge, skin change or tenderness.     Left: No mass, nipple discharge, skin change or tenderness.  Neck:     Thyroid: No thyromegaly.  Cardiovascular:     Rate and Rhythm: Normal rate and regular rhythm.     Heart sounds: Normal heart sounds. No murmur heard. Pulmonary:     Effort: Pulmonary effort is normal.     Breath sounds: Normal breath sounds.  Abdominal:     Palpations: Abdomen is soft.     Tenderness: There is no abdominal tenderness. There is no guarding or rebound.  Musculoskeletal:        General: Normal range of motion.     Cervical back: Normal range of motion.  Lymphadenopathy:     Cervical: No cervical adenopathy.  Neurological:     General: No focal deficit present.     Mental Status: She is alert and oriented to person, place, and time.     Cranial Nerves: No cranial nerve deficit.  Skin:    General: Skin is warm and dry.  Psychiatric:        Mood and Affect: Mood normal.        Behavior: Behavior normal.        Thought Content: Thought content normal.        Judgment: Judgment normal.  Vitals reviewed.     Assessment/Plan: No diagnosis found.            GYN counsel adequate intake of calcium and vitamin D, diet and exercise     F/U  No follow-ups on file.  Roran Wegner B. Ilanna Deihl, PA-C 08/14/2022 8:11 PM

## 2022-08-15 ENCOUNTER — Ambulatory Visit (INDEPENDENT_AMBULATORY_CARE_PROVIDER_SITE_OTHER): Payer: No Typology Code available for payment source | Admitting: Obstetrics and Gynecology

## 2022-08-15 ENCOUNTER — Encounter: Payer: Self-pay | Admitting: Obstetrics and Gynecology

## 2022-08-15 VITALS — BP 122/70 | Ht 63.0 in | Wt 127.0 lb

## 2022-08-15 DIAGNOSIS — Z01419 Encounter for gynecological examination (general) (routine) without abnormal findings: Secondary | ICD-10-CM | POA: Diagnosis not present

## 2022-08-15 NOTE — Patient Instructions (Signed)
I value your feedback and you entrusting us with your care. If you get a [facility] patient survey, I would appreciate you taking the time to let us know about your experience today. Thank you! ? ? ?

## 2023-07-10 ENCOUNTER — Telehealth: Payer: Self-pay | Admitting: Obstetrics and Gynecology

## 2023-07-10 NOTE — Telephone Encounter (Signed)
Left message for patient to call office back to schedule annual appt with [facility] after 08/16/23

## 2023-07-11 ENCOUNTER — Telehealth: Payer: Self-pay | Admitting: Obstetrics and Gynecology

## 2023-07-11 NOTE — Telephone Encounter (Signed)
This pt had her annual on 08/15/2022.  She is needing to have her annual before 2024 ends for insurance purposes.    You don't have any more "annual" spots.  Can I use one of your GYN/NEW GYN spots on Dec. 30 or 31 for this pt?

## 2023-07-14 NOTE — Telephone Encounter (Signed)
Yes

## 2023-08-15 NOTE — Progress Notes (Unsigned)
PCP:  Nira Retort   No chief complaint on file.   HPI:      [name] is a 37 y.o. G3P1003 who LMP was No LMP recorded., presents today for her annual examination.  Her menses are regular every 4-5 wks, lasting 5 days, light to mod flow.  Dysmenorrhea mild to none, no BTB.  Sex activity: single partner, contraception - tubal ligation. No pain/bleeding. Last Pap: 07/24/21 Results were: no abnormalities /neg HPV DNA   There is no FH of breast cancer. There is no FH of ovarian cancer. The patient does  self-breast exams.  Tobacco use: The patient denies current or previous tobacco use. Alcohol use: none No drug use.  Exercise: moderately active     She does get adequate calcium and Vitamin D in her diet. Labs with PCP  Past Medical History:  Diagnosis Date   Anxiety    occasional   Attention deficit     Past Surgical History:  Procedure Laterality Date   LUMBAR LAMINECTOMY/DECOMPRESSION MICRODISCECTOMY Left 08/10/2018   Procedure: LUMBAR LAMINECTOMY/DECOMPRESSION MICRODISCECTOMY 1 LEVEL-L5-S1(HEMILAMINOTOMY,FORAMINOTOMY);  Surgeon: Lucy Chris, MD;  Location: [facility];  Service: Neurosurgery;  Laterality: Left;   TONSILLECTOMY     37 yo   TUBAL LIGATION Bilateral 02/01/2016   Procedure: POST PARTUM TUBAL LIGATION;  Surgeon: Vena Austria, MD;  Location: [facility];  Service: Gynecology;  Laterality: Bilateral;    No family history on file.  Social History   Socioeconomic History   Marital status: Married    Spouse name: Not on file   Number of children: Not on file   Years of education: Not on file   Highest education level: Not on file  Occupational History   Not on file  Tobacco Use   Smoking status: Never   Smokeless tobacco: Never  Vaping Use   Vaping status: Never Used  Substance and Sexual Activity   Alcohol use: No   Drug use: No   Sexual activity: Yes    Birth control/protection: Surgical    Comment: Tubal ligation  Other  Topics Concern   Not on file  Social History Narrative   Not on file   Social Drivers of Health   Financial Resource Strain: Not on file  Food Insecurity: Not on file  Transportation Needs: Not on file  Physical Activity: Not on file  Stress: Not on file  Social Connections: Not on file  Intimate Partner Violence: Not on file     Current Outpatient Medications:    phentermine (ADIPEX-P) 37.5 MG tablet, Take 37.5 mg by mouth every morning. (Patient not taking: Reported on 07/24/2021), Disp: , Rfl:    valACYclovir (VALTREX) 1000 MG tablet, 2 po ,repeat 12 hrs, Disp: , Rfl:      ROS:  Review of Systems  Constitutional:  Negative for fatigue, fever and unexpected weight change.  Respiratory:  Negative for cough, shortness of breath and wheezing.   Cardiovascular:  Negative for chest pain, palpitations and leg swelling.  Gastrointestinal:  Negative for blood in stool, constipation, diarrhea, nausea and vomiting.  Endocrine: Negative for cold intolerance, heat intolerance and polyuria.  Genitourinary:  Negative for dyspareunia, dysuria, flank pain, frequency, genital sores, hematuria, menstrual problem, pelvic pain, urgency, vaginal bleeding, vaginal discharge and vaginal pain.  Musculoskeletal:  Negative for back pain, joint swelling and myalgias.  Skin:  Negative for rash.  Neurological:  Negative for dizziness, syncope, light-headedness, numbness and headaches.  Hematological:  Negative for adenopathy.  Psychiatric/Behavioral:  Negative for agitation, confusion, sleep disturbance and suicidal ideas. The patient is not nervous/anxious.   BREAST: No symptoms   Objective: There were no vitals taken for this visit.   Physical Exam Constitutional:      Appearance: She is well-developed.  Genitourinary:     Vulva normal.     Right Labia: No rash, tenderness or lesions.    Left Labia: No tenderness, lesions or rash.    No vaginal discharge, erythema or tenderness.       Right Adnexa: not tender and no mass present.    Left Adnexa: not tender and no mass present.    No cervical friability or polyp.     Uterus is not enlarged or tender.  Breasts:    Right: No mass, nipple discharge, skin change or tenderness.     Left: No mass, nipple discharge, skin change or tenderness.  Neck:     Thyroid: No thyromegaly.  Cardiovascular:     Rate and Rhythm: Normal rate and regular rhythm.     Heart sounds: Normal heart sounds. No murmur heard. Pulmonary:     Effort: Pulmonary effort is normal.     Breath sounds: Normal breath sounds.  Abdominal:     Palpations: Abdomen is soft.     Tenderness: There is no abdominal tenderness. There is no guarding or rebound.  Musculoskeletal:        General: Normal range of motion.     Cervical back: Normal range of motion.  Lymphadenopathy:     Cervical: No cervical adenopathy.  Neurological:     General: No focal deficit present.     Mental Status: She is alert and oriented to person, place, and time.     Cranial Nerves: No cranial nerve deficit.  Skin:    General: Skin is warm and dry.  Psychiatric:        Mood and Affect: Mood normal.        Behavior: Behavior normal.        Thought Content: Thought content normal.        Judgment: Judgment normal.  Vitals reviewed.     Assessment/Plan: No diagnosis found.            GYN counsel adequate intake of calcium and vitamin D, diet and exercise     F/U  No follow-ups on file.  Arash Karstens B. Darsha Zumstein, PA-C 08/15/2023 2:40 PM

## 2023-08-18 ENCOUNTER — Encounter: Payer: Self-pay | Admitting: Obstetrics and Gynecology

## 2023-08-18 ENCOUNTER — Ambulatory Visit (INDEPENDENT_AMBULATORY_CARE_PROVIDER_SITE_OTHER): Payer: No Typology Code available for payment source | Admitting: Obstetrics and Gynecology

## 2023-08-18 VITALS — BP 110/72 | HR 75 | Wt 131.8 lb

## 2023-08-18 DIAGNOSIS — Z01419 Encounter for gynecological examination (general) (routine) without abnormal findings: Secondary | ICD-10-CM

## 2023-08-18 NOTE — Patient Instructions (Signed)
I value your feedback and you entrusting us with your care. If you get a [facility] patient survey, I would appreciate you taking the time to let us know about your experience today. Thank you! ? ? ?

## 2023-08-25 ENCOUNTER — Ambulatory Visit: Payer: No Typology Code available for payment source | Admitting: Obstetrics and Gynecology

## 2024-04-20 ENCOUNTER — Inpatient Hospital Stay
Admission: RE | Admit: 2024-04-20 | Discharge: 2024-04-20 | Disposition: A | Discharge status: Home / Self Care | Payer: Self-pay | Source: Ambulatory Visit | Attending: Physician Assistant | Admitting: Physician Assistant

## 2024-04-20 ENCOUNTER — Other Ambulatory Visit: Payer: Self-pay | Admitting: Family Medicine

## 2024-04-20 DIAGNOSIS — Z049 Encounter for examination and observation for unspecified reason: Secondary | ICD-10-CM

## 2024-04-23 NOTE — Progress Notes (Deleted)
 Referring Physician:  Valora Agent, MD [address] [place],  [place] [address]  Primary Physician:  [facility]  History of Present Illness: 04/23/2024 [name] is here today with a chief complaint of ***  Back pain Leg pain   Duration: *** Location: *** Quality: *** Severity: ***  Precipitating: aggravated by *** Modifying factors: made better by *** Weakness: none Timing: *** Bowel/Bladder Dysfunction: none  Conservative measures:  Physical therapy: *** has not participated in recently? Multimodal medical therapy including regular antiinflammatories: *** gabapentin  Injections: *** she has had some back in 2019 with Dr. Kassie but none recently.   Past Surgery: *** 08/10/18: L5-S1 Laminectomy by Dr. Bluford Ginger GORMAN Baller has ***no symptoms of cervical myelopathy.  The symptoms are causing a significant impact on the patient's life.   Review of Systems:  A 10 point review of systems is negative, except for the pertinent positives and negatives detailed in the HPI.  Past Medical History: Past Medical History:  Diagnosis Date   Anxiety    occasional   Attention deficit     Past Surgical History: Past Surgical History:  Procedure Laterality Date   LUMBAR LAMINECTOMY/DECOMPRESSION MICRODISCECTOMY Left 08/10/2018   Procedure: LUMBAR LAMINECTOMY/DECOMPRESSION MICRODISCECTOMY 1 LEVEL-L5-S1(HEMILAMINOTOMY,FORAMINOTOMY);  Surgeon: Bluford Standing, MD;  Location: [facility];  Service: Neurosurgery;  Laterality: Left;   TONSILLECTOMY     38 yo   TUBAL LIGATION Bilateral 02/01/2016   Procedure: POST PARTUM TUBAL LIGATION;  Surgeon: Glory High, MD;  Location: [facility];  Service: Gynecology;  Laterality: Bilateral;    Allergies: Allergies as of 04/27/2024 - Review Complete 08/18/2023  Allergen Reaction Noted   Magnesium Other (See Comments) 08/25/2018    Medications: Outpatient Encounter Medications as of 04/27/2024   Medication Sig   famotidine  (PEPCID ) 20 MG tablet Take 20 mg by mouth 2 (two) times daily.   gabapentin  (NEURONTIN ) 100 MG capsule Take 200-300 mg by mouth.   metFORMIN (GLUCOPHAGE-XR) 500 MG 24 hr tablet Take 500 mg by mouth.   nystatin cream (MYCOSTATIN) Apply topically 2 (two) times daily.   Semaglutide,0.25 or 0.5MG /DOS, 2 MG/3ML SOPN Inject 0.5 mg into the skin.   buPROPion (WELLBUTRIN XL) 150 MG 24 hr tablet Take 150 mg by mouth every morning.   escitalopram (LEXAPRO) 10 MG tablet Take 10 mg by mouth daily.   phentermine (ADIPEX-P) 37.5 MG tablet Take 37.5 mg by mouth every morning. (Patient not taking: Reported on 08/18/2023)   valACYclovir (VALTREX) 1000 MG tablet 2 po ,repeat 12 hrs   No facility-administered encounter medications on file as of 04/27/2024.    Social History: Social History   Tobacco Use   Smoking status: Never   Smokeless tobacco: Never  Vaping Use   Vaping status: Never Used  Substance Use Topics   Alcohol use: No   Drug use: No    Family Medical History: Family History  Problem Relation Age of Onset   Breast cancer Neg Hx     Physical Examination: @VITALWITHPAIN @  General: Patient is well developed, well nourished, calm, collected, and in no apparent distress. Attention to examination is appropriate.  Psychiatric: Patient is non-anxious.  Head:  Pupils equal, round, and reactive to light.  ENT:  Oral mucosa appears well hydrated.  Neck:   Supple.  ***Full range of motion.  Respiratory: Patient is breathing without any difficulty.  Extremities: No edema.  Vascular: Palpable dorsal pedal pulses.  Skin:   On exposed skin, there are no  abnormal skin lesions.  NEUROLOGICAL:     Awake, alert, oriented to person, place, and time.  Speech is clear and fluent. Fund of knowledge is appropriate.   Cranial Nerves: Pupils equal round and reactive to light.  Facial tone is symmetric.  Facial sensation is symmetric.  ROM of spine: ***full.   Palpation of spine: ***non tender.    Strength: Side Biceps Triceps Deltoid Interossei Grip Wrist Ext. Wrist Flex.  R 5 5 5 5 5 5 5   L 5 5 5 5 5 5 5    Side Iliopsoas Quads Hamstring PF DF EHL  R 5 5 5 5 5 5   L 5 5 5 5 5 5    Reflexes are ***2+ and symmetric at the biceps, triceps, brachioradialis, patella and achilles.   Hoffman's is absent.  Clonus is not present.  Toes are down-going.  Bilateral upper and lower extremity sensation is intact to light touch.    Gait is normal.   No difficulty with tandem gait.   No evidence of dysmetria noted.  Medical Decision Making  Imaging: ***  I have personally reviewed the images and agree with the above interpretation.  Assessment and Plan: Ms. Deemer is a pleasant 38 y.o. female with ***    Thank you for involving me in the care of this patient.   I spent a total of *** minutes in both face-to-face and non-face-to-face activities for this visit on the date of this encounter.   Lyle Decamp, PA-C Dept. of Neurosurgery

## 2024-04-27 ENCOUNTER — Ambulatory Visit: Payer: Self-pay | Admitting: Physician Assistant

## 2024-06-07 NOTE — Progress Notes (Deleted)
 Referring Physician:  Valora Lynwood FALCON, MD [address] [place],  [place] [address]  Primary Physician:  Cletus Glenn  History of Present Illness: 06/07/2024*** Ms. Mignonne Afonso has a history of anxiety, depression, eczema.   She has history of left S1-S2 hemilaminectomy/foraminotomy on 08/10/18.   Right toe numbness.  Left buttock and anterior thigh pain. Pain while sitting.   She is taking neurontin , robaxin , and ultram .   Duration: *** Location: *** Quality: *** Severity: ***  Precipitating: aggravated by *** Modifying factors: made better by *** Weakness: none Timing: ***  Tobacco use: Does not smoke.   Bowel/Bladder Dysfunction: none  Conservative measures:  Physical therapy: *** Has not participated on? Multimodal medical therapy including regular antiinflammatories: *** gabapentin  Injections: *** epidural steroid injections?  Past Surgery:  left S1-S2 hemilaminectomy/foraminotomy on 08/10/18 by Dr. Bluford Ginger GORMAN Baller has ***no symptoms of cervical myelopathy.  The symptoms are causing a significant impact on the patient's life.   Review of Systems:  A 10 point review of systems is negative, except for the pertinent positives and negatives detailed in the HPI.  Past Medical History: Past Medical History:  Diagnosis Date   Anxiety    occasional   Attention deficit     Past Surgical History: Past Surgical History:  Procedure Laterality Date   LUMBAR LAMINECTOMY/DECOMPRESSION MICRODISCECTOMY Left 08/10/2018   Procedure: LUMBAR LAMINECTOMY/DECOMPRESSION MICRODISCECTOMY 1 LEVEL-L5-S1(HEMILAMINOTOMY,FORAMINOTOMY);  Surgeon: Bluford Standing, MD;  Location: [facility];  Service: Neurosurgery;  Laterality: Left;   TONSILLECTOMY     38 yo   TUBAL LIGATION Bilateral 02/01/2016   Procedure: POST PARTUM TUBAL LIGATION;  Surgeon: Glory High, MD;  Location: [facility];  Service: Gynecology;  Laterality: Bilateral;     Allergies: Allergies as of 06/08/2024 - Review Complete 08/18/2023  Allergen Reaction Noted   Magnesium Other (See Comments) 08/25/2018    Medications: Outpatient Encounter Medications as of 06/08/2024  Medication Sig   buPROPion (WELLBUTRIN XL) 150 MG 24 hr tablet Take 150 mg by mouth every morning.   escitalopram (LEXAPRO) 10 MG tablet Take 10 mg by mouth daily.   famotidine  (PEPCID ) 20 MG tablet Take 20 mg by mouth 2 (two) times daily.   gabapentin  (NEURONTIN ) 100 MG capsule Take 200-300 mg by mouth.   metFORMIN (GLUCOPHAGE-XR) 500 MG 24 hr tablet Take 500 mg by mouth.   nystatin cream (MYCOSTATIN) Apply topically 2 (two) times daily.   phentermine (ADIPEX-P) 37.5 MG tablet Take 37.5 mg by mouth every morning. (Patient not taking: Reported on 08/18/2023)   Semaglutide,0.25 or 0.5MG /DOS, 2 MG/3ML SOPN Inject 0.5 mg into the skin.   valACYclovir (VALTREX) 1000 MG tablet 2 po ,repeat 12 hrs   No facility-administered encounter medications on file as of 06/08/2024.    Social History: Social History   Tobacco Use   Smoking status: Never   Smokeless tobacco: Never  Vaping Use   Vaping status: Never Used  Substance Use Topics   Alcohol use: No   Drug use: No    Family Medical History: Family History  Problem Relation Age of Onset   Breast cancer Neg Hx     Physical Examination: There were no vitals filed for this visit.  General: Patient is well developed, well nourished, calm, collected, and in no apparent distress. Attention to examination is appropriate.  Respiratory: Patient is breathing without any difficulty.   NEUROLOGICAL:     Awake, alert, oriented to person, place, and time.  Speech is clear and  fluent. Fund of knowledge is appropriate.   Cranial Nerves: Pupils equal round and reactive to light.  Facial tone is symmetric.    *** ROM of cervical spine *** pain *** posterior cervical tenderness. *** tenderness in bilateral trapezial region.   ***  ROM of lumbar spine *** pain *** posterior lumbar tenderness.   No abnormal lesions on exposed skin.   Strength: Side Biceps Triceps Deltoid Interossei Grip Wrist Ext. Wrist Flex.  R 5 5 5 5 5 5 5   L 5 5 5 5 5 5 5    Side Iliopsoas Quads Hamstring PF DF EHL  R 5 5 5 5 5 5   L 5 5 5 5 5 5    Reflexes are ***2+ and symmetric at the biceps, brachioradialis, patella and achilles.   Hoffman's is absent.  Clonus is not present.   Bilateral upper and lower extremity sensation is intact to light touch.     Gait is normal.   ***No difficulty with tandem gait.    Medical Decision Making  Imaging: Lumbar xrays dated 02/09/24:  TECHNIQUE AND FINDINGS:  2 view lumbar spine   6 nonrib-bearing lumbar vertebral bodies vertebral body heights and lumbar  lordosis maintained.  Multilevel disc space narrowing appreciated most  prominent at L6 and S1.  Grade 2 anterolisthesis L6-S1.  Soft tissues  unremarkable.  No evidence of acute fracture or dislocation.   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  Grade 2 anterolisthesis L6-S1.   No acute osseous abnormalities.   HECTOR LEMOND FELL, MD  I have personally reviewed the images and agree with the above interpretation.  Assessment and Plan: Ms. Dorwart is a pleasant 38 y.o. female has ***  Treatment options discussed with patient and following plan made:   - Order for physical therapy for *** spine ***. Patient to call to schedule appointment. *** - Continue current medications including ***. Reviewed dosing and side effects.  - Prescription for ***. Reviewed dosing and side effects. Take with food.  - Prescription for *** to take prn muscle spasms. Reviewed dosing and side effects. Discussed this can cause drowsiness.  - MRI of *** to further evaluate *** radiculopathy. No improvement time or medications (***).  - Referral to PMR at [facility] to discuss possible  *** injections.  - Will schedule phone visit to review MRI results once I get them back.   I spent a total of *** minutes in face-to-face and non-face-to-face activities related to this patient's care today including review of outside records, review of imaging, review of symptoms, physical exam, discussion of differential diagnosis, discussion of treatment options, and documentation.   Thank you for involving me in the care of this patient.   Glade Boys PA-C Dept. of Neurosurgery

## 2024-06-08 ENCOUNTER — Ambulatory Visit: Admitting: Orthopedic Surgery

## 2024-08-18 NOTE — Progress Notes (Deleted)
 "   GYNECOLOGY ANNUAL PHYSICAL EXAM PROGRESS NOTE  Subjective:    Claudia Casey is a 38 y.o. G78P1003 female who presents for an annual exam.  The patient is sexually active. The patient participates in regular exercise: yes. Has the patient ever been transfused or tattooed?: no. The patient reports that there is not domestic violence in her life.   The patient has the following complaints today:   Menstrual History: Menarche age: 56 Patient's last menstrual period was 08/23/2024 (exact date). Period Cycle (Days): 21 Period Duration (Days): 5 Period Pattern: Regular Menstrual Flow: Light, Heavy Menstrual Control: Maxi pad Menstrual Control Change Freq (Hours): 3-4 Dysmenorrhea: (!) Mild Dysmenorrhea Symptoms: Cramping   Gynecologic History:  Contraception: tubal ligation History of STI's: denies Last Pap: 07/24/2021. Results were: normal. Notes h/o abnormal pap smears. Last mammogram: Never Done     OB History  Gravida Para Term Preterm AB Living  3 3 1  0 0 3  SAB IAB Ectopic Multiple Live Births  0 0 0 0 3    # Outcome Date GA Lbr Len/2nd Weight Sex Type Anes PTL Lv  3 Term 01/31/16 [redacted]w[redacted]d  7 lb 15 oz (3.6 kg) M Vag-Spont EPI  LIV     Name: BALLER TINA GINGER     Apgar1: 7  Apgar5: 9  2 Para 04/21/07    M Vag-Spont EPI N LIV  1 Para 11/21/03    F Vag-Spont EPI N LIV    Past Medical History:  Diagnosis Date   Anxiety    occasional   Attention deficit     Past Surgical History:  Procedure Laterality Date   LUMBAR LAMINECTOMY/DECOMPRESSION MICRODISCECTOMY Left 08/10/2018   Procedure: LUMBAR LAMINECTOMY/DECOMPRESSION MICRODISCECTOMY 1 LEVEL-L5-S1(HEMILAMINOTOMY,FORAMINOTOMY);  Surgeon: Bluford Standing, MD;  Location: [facility];  Service: Neurosurgery;  Laterality: Left;   TONSILLECTOMY     38 yo   TUBAL LIGATION Bilateral 02/01/2016   Procedure: POST PARTUM TUBAL LIGATION;  Surgeon: Glory High, MD;  Location: [facility];  Service: Gynecology;  Laterality:  Bilateral;    Family History  Problem Relation Age of Onset   Breast cancer Neg Hx     Social History   Socioeconomic History   Marital status: Married    Spouse name: Not on file   Number of children: Not on file   Years of education: Not on file   Highest education level: Not on file  Occupational History   Not on file  Tobacco Use   Smoking status: Never   Smokeless tobacco: Never  Vaping Use   Vaping status: Never Used  Substance and Sexual Activity   Alcohol use: No   Drug use: No   Sexual activity: Yes    Birth control/protection: Surgical    Comment: Tubal ligation  Other Topics Concern   Not on file  Social History Narrative   Not on file   Social Drivers of Health   Tobacco Use: Low Risk  (02/11/2024)   Received from [facility]   Patient History    Smoking Tobacco Use: Never    Smokeless Tobacco Use: Never    Passive Exposure: Not on file  Financial Resource Strain: Not on file  Food Insecurity: Not on file  Transportation Needs: Not on file  Physical Activity: Not on file  Stress: Not on file  Social Connections: Not on file  Intimate Partner Violence: Not on file  Depression (EYV7-0): Not on file  Alcohol Screen: Not on file  Housing: Unknown (02/11/2024)  Received from [facility]   Epic    Unable to Pay for Housing in the Last Year: Not on file    Number of Times Moved in the Last Year: Not on file    At any time in the past 12 months, were you homeless or living in a shelter (including now)?: No  Utilities: Not on file  Health Literacy: Not on file    Medications Ordered Prior to Encounter[1]  Allergies[2]   Review of Systems Constitutional: negative for chills, fatigue, fevers and sweats Eyes: negative for irritation, redness and visual disturbance Ears, nose, mouth, throat, and face: negative for hearing loss, nasal congestion, snoring and tinnitus Respiratory: negative for asthma, cough,  sputum Cardiovascular: negative for chest pain, dyspnea, exertional chest pressure/discomfort, irregular heart beat, palpitations and syncope Gastrointestinal: negative for abdominal pain, change in bowel habits, nausea and vomiting Genitourinary: negative for abnormal menstrual periods, genital lesions, sexual problems and vaginal discharge, dysuria and urinary incontinence Integument/breast: negative for breast lump, breast tenderness and nipple discharge Hematologic/lymphatic: negative for bleeding and easy bruising Musculoskeletal:negative for back pain and muscle weakness Neurological: negative for dizziness, headaches, vertigo and weakness Endocrine: negative for diabetic symptoms including polydipsia, polyuria and skin dryness Allergic/Immunologic: negative for hay fever and urticaria      Objective:  Resp. rate 16, height 5' 3 (1.6 m), weight 162 lb 14.4 oz (73.9 kg), last menstrual period 08/23/2024. Body mass index is 28.86 kg/m.    General Appearance:    Alert, cooperative, no distress, appears stated age  Head:    Normocephalic, without obvious abnormality, atraumatic  Eyes:    PERRL, conjunctiva/corneas clear, EOM's intact, both eyes  Ears:    Normal external ear canals, both ears  Nose:   Nares normal, septum midline, mucosa normal, no drainage or sinus tenderness  Throat:   Lips, mucosa, and tongue normal; teeth and gums normal  Neck:   Supple, symmetrical, trachea midline, no adenopathy; thyroid: no enlargement/tenderness/nodules; no carotid bruit or JVD  Back:     Symmetric, no curvature, ROM normal, no CVA tenderness  Lungs:     Clear to auscultation bilaterally, respirations unlabored  Chest Wall:    No tenderness or deformity   Heart:    Regular rate and rhythm, S1 and S2 normal, no murmur, rub or gallop  Breast Exam:    No tenderness, masses, or nipple abnormality  Abdomen:     Soft, non-tender, bowel sounds active all four quadrants, no masses, no organomegaly.     Genitalia:    Pelvic:external genitalia normal, vagina without lesions, discharge, or tenderness, rectovaginal septum  normal. Cervix normal in appearance, no cervical motion tenderness, no adnexal masses or tenderness.  Uterus normal size, shape, mobile, regular contours, nontender.  Rectal:    Normal external sphincter.  No hemorrhoids appreciated. Internal exam not done.   Extremities:   Extremities normal, atraumatic, no cyanosis or edema  Pulses:   2+ and symmetric all extremities  Skin:   Skin color, texture, turgor normal, no rashes or lesions  Lymph nodes:   Cervical, supraclavicular, and axillary nodes normal  Neurologic:   CNII-XII intact, normal strength, sensation and reflexes throughout   .  Labs:  Lab Results  Component Value Date   WBC 9.8 08/15/2018   HGB 14.1 08/15/2018   HCT 42.6 08/15/2018   MCV 91.0 08/15/2018   PLT 325 08/15/2018    Lab Results  Component Value Date   CREATININE 0.69 08/15/2018   BUN 14  08/15/2018   NA 138 08/15/2018   K 3.9 08/15/2018   CL 104 08/15/2018   CO2 27 08/15/2018    Lab Results  Component Value Date   ALT 18 03/24/2012   AST 22 03/24/2012   ALKPHOS 65 03/24/2012   BILITOT 0.5 03/24/2012    No results found for: TSH   Assessment:   1. Encounter for well woman exam with routine gynecological exam   2. Encounter for screening mammogram for malignant neoplasm of breast   3. Screening for diabetes mellitus (DM)   4. Screening cholesterol level   5. Thyroid disorder screening      Plan:  Blood tests: {blood tests:13147}. Breast self exam technique reviewed and patient encouraged to perform self-exam monthly. Contraception: tubal ligation. Discussed healthy lifestyle modifications. Mammogram ordered Pap smear utd. Flu vaccine:  Follow up in 1 year for annual exam   Damien Parsley, CNM [facility]     [1]  Current Outpatient Medications on File Prior to Visit  Medication Sig  Dispense Refill   buPROPion (WELLBUTRIN XL) 150 MG 24 hr tablet Take 150 mg by mouth every morning.     escitalopram (LEXAPRO) 10 MG tablet Take 10 mg by mouth daily.     famotidine  (PEPCID ) 20 MG tablet Take 20 mg by mouth 2 (two) times daily.     gabapentin  (NEURONTIN ) 100 MG capsule Take 200-300 mg by mouth.     metFORMIN (GLUCOPHAGE-XR) 500 MG 24 hr tablet Take 500 mg by mouth.     nystatin cream (MYCOSTATIN) Apply topically 2 (two) times daily.     phentermine (ADIPEX-P) 37.5 MG tablet Take 37.5 mg by mouth every morning. (Patient not taking: Reported on 08/18/2023)     Semaglutide,0.25 or 0.5MG /DOS, 2 MG/3ML SOPN Inject 0.5 mg into the skin.     valACYclovir (VALTREX) 1000 MG tablet 2 po ,repeat 12 hrs     No current facility-administered medications on file prior to visit.  [2]  Allergies Allergen Reactions   Magnesium Other (See Comments)    Felt like body was on fire inside   "

## 2024-08-23 ENCOUNTER — Ambulatory Visit (INDEPENDENT_AMBULATORY_CARE_PROVIDER_SITE_OTHER): Admitting: Certified Nurse Midwife

## 2024-08-23 ENCOUNTER — Encounter: Payer: Self-pay | Admitting: Certified Nurse Midwife

## 2024-08-23 VITALS — BP 110/80 | HR 72 | Resp 16 | Ht 63.0 in | Wt 162.9 lb

## 2024-08-23 DIAGNOSIS — Z01419 Encounter for gynecological examination (general) (routine) without abnormal findings: Secondary | ICD-10-CM | POA: Diagnosis not present

## 2024-08-23 DIAGNOSIS — Z131 Encounter for screening for diabetes mellitus: Secondary | ICD-10-CM

## 2024-08-23 DIAGNOSIS — Z1231 Encounter for screening mammogram for malignant neoplasm of breast: Secondary | ICD-10-CM

## 2024-08-23 DIAGNOSIS — Z1329 Encounter for screening for other suspected endocrine disorder: Secondary | ICD-10-CM

## 2024-08-23 DIAGNOSIS — Z13220 Encounter for screening for lipoid disorders: Secondary | ICD-10-CM

## 2024-08-23 NOTE — Progress Notes (Signed)
 "   GYNECOLOGY ANNUAL PHYSICAL EXAM PROGRESS NOTE  Subjective:    Claudia Casey is a 39 y.o. G66P1003 female who presents for an annual exam.  The patient is sexually active. The patient participates in regular exercise: yes. Has the patient ever been transfused or tattooed?: no. The patient reports that there is not domestic violence in her life.   The patient has the following complaints today: In December patient noticed heart palpitation or muscle spasm when she turns to right side, she can palpate it when it happens periodically. Has not had any recent falls or injuries. No shortness of breath, or chest pain associated with palpitation.    Menstrual History: Menarche age: 80 Patient's last menstrual period was 08/23/2024 (exact date). Period Cycle (Days): 21 Period Duration (Days): 5 Period Pattern: Regular Menstrual Flow: Light, Heavy Menstrual Control: Maxi pad Menstrual Control Change Freq (Hours): 3-4 Dysmenorrhea: (!) Mild Dysmenorrhea Symptoms: Cramping   Gynecologic History:  Contraception: tubal ligation History of STI's: denies Last Pap: 07/24/2021. Results were: normal. Notes h/o abnormal pap smears. Last mammogram: Never Done  [facility] - 08/23/24 [id number]       Pregnancy Intention Screening   Does the patient want to become pregnant in the next year? No    Does the patient's partner want to become pregnant in the next year? No    Would the patient like to discuss contraceptive options today? N/A      Contraception Wrap Up   Current Method Female Sterilization    End Method Female Sterilization    Contraception Counseling Provided No    How was the end contraceptive method provided? N/A            OB History  Gravida Para Term Preterm AB Living  3 3 1  0 0 3  SAB IAB Ectopic Multiple Live Births  0 0 0 0 3    # Outcome Date GA Lbr Len/2nd Weight Sex Type Anes PTL Lv  3 Term 01/31/16 [redacted]w[redacted]d  3600 g M Vag-Spont EPI  LIV     Name: SALENA, ORTLIEB      Apgar1: 7  Apgar5: 9  2 Para 04/21/07    M Vag-Spont EPI N LIV  1 Para 11/21/03    F Vag-Spont EPI N LIV    Past Medical History:  Diagnosis Date   Anxiety    occasional   Attention deficit     Past Surgical History:  Procedure Laterality Date   LUMBAR LAMINECTOMY/DECOMPRESSION MICRODISCECTOMY Left 08/10/2018   Procedure: LUMBAR LAMINECTOMY/DECOMPRESSION MICRODISCECTOMY 1 LEVEL-L5-S1(HEMILAMINOTOMY,FORAMINOTOMY);  Surgeon: Bluford Standing, MD;  Location: [facility];  Service: Neurosurgery;  Laterality: Left;   TONSILLECTOMY     39 yo   TUBAL LIGATION Bilateral 02/01/2016   Procedure: POST PARTUM TUBAL LIGATION;  Surgeon: Glory High, MD;  Location: [facility];  Service: Gynecology;  Laterality: Bilateral;    Family History  Problem Relation Age of Onset   Breast cancer Neg Hx     Social History   Socioeconomic History   Marital status: Married    Spouse name: Not on file   Number of children: Not on file   Years of education: Not on file   Highest education level: Not on file  Occupational History   Not on file  Tobacco Use   Smoking status: Never   Smokeless tobacco: Never  Vaping Use   Vaping status: Never Used  Substance and Sexual Activity   Alcohol use: No   Drug use: No  Sexual activity: Yes    Birth control/protection: Surgical    Comment: Tubal ligation  Other Topics Concern   Not on file  Social History Narrative   Not on file   Social Drivers of Health   Tobacco Use: Low Risk (08/23/2024)   Patient History    Smoking Tobacco Use: Never    Smokeless Tobacco Use: Never    Passive Exposure: Not on file  Financial Resource Strain: Not on file  Food Insecurity: Not on file  Transportation Needs: Not on file  Physical Activity: Not on file  Stress: Not on file  Social Connections: Not on file  Intimate Partner Violence: Not on file  Depression (PHQ2-9): Low Risk (08/23/2024)   Depression (PHQ2-9)    PHQ-2 Score: 0  Alcohol Screen: Not on file   Housing: Unknown (02/11/2024)   Received from [facility]   Epic    Unable to Pay for Housing in the Last Year: Not on file    Number of Times Moved in the Last Year: Not on file    At any time in the past 12 months, were you homeless or living in a shelter (including now)?: No  Utilities: Not on file  Health Literacy: Not on file    Medications Ordered Prior to Encounter[1]  Allergies[2]   Review of Systems Constitutional: negative for chills, fatigue, fevers and sweats Eyes: negative for irritation, redness and visual disturbance Ears, nose, mouth, throat, and face: negative for hearing loss, nasal congestion, snoring and tinnitus Respiratory: negative for asthma, cough, sputum Cardiovascular: negative for chest pain, dyspnea, exertional chest pressure/discomfort, irregular heart beat, palpitations and syncope Gastrointestinal: negative for abdominal pain, change in bowel habits, nausea and vomiting Genitourinary: negative for abnormal menstrual periods, genital lesions, sexual problems and vaginal discharge, dysuria and urinary incontinence Integument/breast: negative for breast lump, breast tenderness and nipple discharge Hematologic/lymphatic: negative for bleeding and easy bruising Musculoskeletal:negative for back pain and muscle weakness Neurological: negative for dizziness, headaches, vertigo and weakness Endocrine: negative for diabetic symptoms including polydipsia, polyuria and skin dryness Allergic/Immunologic: negative for hay fever and urticaria      Objective:  Blood pressure 110/80, pulse 72, resp. rate 16, height 5' 3 (1.6 m), weight 73.9 kg, last menstrual period 08/23/2024. Body mass index is 28.86 kg/m.    General Appearance:    Alert, cooperative, no distress, appears stated age  Head:    Normocephalic, without obvious abnormality, atraumatic  Eyes:    PERRL, conjunctiva/corneas clear, EOM's intact, both eyes  Ears:    Normal external  ear canals, both ears  Nose:   Nares normal, septum midline, mucosa normal, no drainage or sinus tenderness  Throat:   Lips, mucosa, and tongue normal; teeth and gums normal  Neck:   Supple, symmetrical, trachea midline, no adenopathy; thyroid: no enlargement/tenderness/nodules; no carotid bruit or JVD  Back:     Symmetric, no curvature, ROM normal, no CVA tenderness  Lungs:     Clear to auscultation bilaterally, respirations unlabored  Chest Wall:    No tenderness or deformity   Heart:    Regular rate and rhythm, S1 and S2 normal, no murmur, rub or gallop  Breast Exam:    No tenderness, masses, or nipple abnormality  Abdomen:     Soft, non-tender, bowel sounds active all four quadrants, no masses, no organomegaly.    Genitalia:    Deferred  Rectal:   Deferred  Extremities:   Extremities normal, atraumatic, no cyanosis or edema  Pulses:  2+ and symmetric all extremities  Skin:   Skin color, texture, turgor normal, no rashes or lesions  Lymph nodes:   Cervical, supraclavicular, and axillary nodes normal  Neurologic:   CNII-XII intact, normal strength, sensation and reflexes throughout   .  Labs:  Lab Results  Component Value Date   WBC 9.8 08/15/2018   HGB 14.1 08/15/2018   HCT 42.6 08/15/2018   MCV 91.0 08/15/2018   PLT 325 08/15/2018    Lab Results  Component Value Date   CREATININE 0.69 08/15/2018   BUN 14 08/15/2018   NA 138 08/15/2018   K 3.9 08/15/2018   CL 104 08/15/2018   CO2 27 08/15/2018    Lab Results  Component Value Date   ALT 18 03/24/2012   AST 22 03/24/2012   ALKPHOS 65 03/24/2012   BILITOT 0.5 03/24/2012    No results found for: TSH   Assessment:   1. Encounter for well woman exam with routine gynecological exam   2. Encounter for screening mammogram for malignant neoplasm of breast   3. Screening for diabetes mellitus (DM)   4. Screening cholesterol level   5. Thyroid disorder screening      Plan:  Blood tests: CBC with diff,  Comprehensive metabolic panel, and TSH. Breast self exam technique reviewed and patient encouraged to perform self-exam monthly. Contraception: tubal ligation. Discussed healthy lifestyle modifications. Mammogram ordered Pap smear utd. Follow up in 1 year for annual exam  Discussed right side discomfort she is describing is likely muscular spasm on rib cage, no audible murmur or palpitations on evaluation. Recommended if symptoms worsen or new associated chest pain or shortness of breath to notify provider.  Damien Parsley, CNM [facility]      [1]  Current Outpatient Medications on File Prior to Visit  Medication Sig Dispense Refill   buPROPion (WELLBUTRIN XL) 150 MG 24 hr tablet Take 150 mg by mouth every morning.     escitalopram (LEXAPRO) 10 MG tablet Take 10 mg by mouth daily.     famotidine  (PEPCID ) 20 MG tablet Take 20 mg by mouth 2 (two) times daily.     nystatin cream (MYCOSTATIN) Apply topically 2 (two) times daily.     valACYclovir (VALTREX) 1000 MG tablet 2 po ,repeat 12 hrs     No current facility-administered medications on file prior to visit.  [2]  Allergies Allergen Reactions   Magnesium Other (See Comments)    Felt like body was on fire inside   "

## 2024-08-23 NOTE — Patient Instructions (Signed)
 How to Do a Breast Self-Exam  Doing breast self-exams can help you stay healthy. They're one way to know what's normal for your breasts. They can help you catch a problem while it's still small and can be treated. You need to: Check your breasts often. Tell your doctor about any changes. You should do breast self-exams even if you have breast implants. What you need: A mirror. A well-lit room. A pillow or other soft object. How to do a breast self-exam Look for changes  Take off all the clothes above your waist. Stand in front of a mirror in a room with good lighting. Put your hands down at your sides. Compare your breasts in the mirror. Look for difference between them, such as: Differences in shape. Differences in size. Wrinkles, dips, and bumps in one breast and not the other. Look at each breast for skin changes, such as: Redness. Scaly spots. Spots where your skin is thicker. Dimpling. Open sores. Look for changes in your nipples, such as: Fluid coming out of a nipple. Fluid around a nipple. Bleeding. Dimpling. Redness. A nipple that looks pushed in or that has changed position. Feel for changes Lie on your back. Feel each breast. To do this: Pick a breast to feel. Place a pillow under the shoulder closest to that breast. Put the arm closest to that breast behind your head. Feel the breast using the hand of your other arm. Use the pads of your three middle fingers to make small circles starting near the nipple. Use light, medium, and firm pressure. Keep making circles, moving down over the breast. Stop when you feel your ribs. Start making circles with your fingers again, this time going up until you reach your collarbone. Then, make circles out across your breast and into your armpit area. Squeeze your nipple. Check for fluid and lumps. Do these steps again to check your other breast. Sit or stand in the tub or shower. With soapy water on your skin, feel each  breast the same way you did when you were lying down. Write down what you find Writing down what you find can help you keep track of what you want to tell your doctor. Write down: What's normal for each breast. Any changes you find. Write down: The kind of change. If your breast feels tender or painful. Any lump you find. Write down its size and where it is. When you last had your period. General tips If you're breastfeeding, the best time to check your breasts is after you feed your baby or after you use a breast pump. If you get a period, the best time to check your breasts is 5-7 days after your period ends. With time, you'll get more used to doing the self-exam. You'll also start to know if there are changes in your breasts. Contact a doctor if: You see a change in the shape or size of your breasts or nipples. You see a change in the skin of your breast or nipples. You have fluid coming from your nipples that isn't normal. You find a new lump or thick area. You have breast pain. You have any concerns about your breast health. This information is not intended to replace advice given to you by your health care provider. Make sure you discuss any questions you have with your health care provider. Document Revised: 10/15/2023 Document Reviewed: 10/15/2023 Elsevier Patient Education  2025 [employer].  Preventive Care 29-65 Years Old, Female Preventive care refers to lifestyle  choices and visits with your health care provider that can promote health and wellness. Preventive care visits are also called wellness exams. What can I expect for my preventive care visit? Counseling During your preventive care visit, your health care provider may ask about your: Medical history, including: Past medical problems. Family medical history. Pregnancy history. Current health, including: Menstrual cycle. Method of birth control. Emotional well-being. Home life and relationship  well-being. Sexual activity and sexual health. Lifestyle, including: Alcohol, nicotine  or tobacco, and drug use. Access to firearms. Diet, exercise, and sleep habits. Work and work Astronomer. Sunscreen use. Safety issues such as seatbelt and bike helmet use. Physical exam Your health care provider may check your: Height and weight. These may be used to calculate your BMI (body mass index). BMI is a measurement that tells if you are at a healthy weight. Waist circumference. This measures the distance around your waistline. This measurement also tells if you are at a healthy weight and may help predict your risk of certain diseases, such as type 2 diabetes and high blood pressure. Heart rate and blood pressure. Body temperature. Skin for abnormal spots. What immunizations do I need?  Vaccines are usually given at various ages, according to a schedule. Your health care provider will recommend vaccines for you based on your age, medical history, and lifestyle or other factors, such as travel or where you work. What tests do I need? Screening Your health care provider may recommend screening tests for certain conditions. This may include: Pelvic exam and Pap test. Lipid and cholesterol levels. Diabetes screening. This is done by checking your blood sugar (glucose) after you have not eaten for a while (fasting). Hepatitis B test. Hepatitis C test. HIV (human immunodeficiency virus) test. STI (sexually transmitted infection) testing, if you are at risk. BRCA-related cancer screening. This may be done if you have a family history of breast, ovarian, tubal, or peritoneal cancers. Talk with your health care provider about your test results, treatment options, and if necessary, the need for more tests. Follow these instructions at home: Eating and drinking  Eat a healthy diet that includes fresh fruits and vegetables, whole grains, lean protein, and low-fat dairy products. Take vitamin and  mineral supplements as recommended by your health care provider. Do not drink alcohol if: Your health care provider tells you not to drink. You are pregnant, may be pregnant, or are planning to become pregnant. If you drink alcohol: Limit how much you have to 0-1 drink a day. Know how much alcohol is in your drink. In the [place] one drink equals one 12 oz bottle of beer (355 mL), one 5 oz glass of wine (148 mL), or one 1 oz glass of hard liquor (44 mL). Lifestyle Brush your teeth every morning and night with fluoride toothpaste. Floss one time each day. Exercise for at least 30 minutes 5 or more days each week. Do not use any products that contain nicotine  or tobacco. These products include cigarettes, chewing tobacco, and vaping devices, such as e-cigarettes. If you need help quitting, ask your health care provider. Do not use drugs. If you are sexually active, practice safe sex. Use a condom or other form of protection to prevent STIs. If you do not wish to become pregnant, use a form of birth control. If you plan to become pregnant, see your health care provider for a prepregnancy visit. Find healthy ways to manage stress, such as: Meditation, yoga, or listening to music. Journaling. Talking to a trusted  person. Spending time with friends and family. Minimize exposure to UV radiation to reduce your risk of skin cancer. Safety Always wear your seat belt while driving or riding in a vehicle. Do not drive: If you have been drinking alcohol. Do not ride with someone who has been drinking. If you have been using any mind-altering substances or drugs. While texting. When you are tired or distracted. Wear a helmet and other protective equipment during sports activities. If you have firearms in your house, make sure you follow all gun safety procedures. Seek help if you have been physically or sexually abused. What's next? Go to your health care provider once a year for an annual wellness  visit. Ask your health care provider how often you should have your eyes and teeth checked. Stay up to date on all vaccines. This information is not intended to replace advice given to you by your health care provider. Make sure you discuss any questions you have with your health care provider. Document Revised: 01/31/2021 Document Reviewed: 01/31/2021 Elsevier Patient Education  2024 [employer].

## 2024-08-24 LAB — COMPREHENSIVE METABOLIC PANEL WITH GFR
ALT: 18 IU/L (ref 0–32)
AST: 16 IU/L (ref 0–40)
Albumin: 4.4 g/dL (ref 3.9–4.9)
Alkaline Phosphatase: 63 IU/L (ref 41–116)
BUN/Creatinine Ratio: 20 (ref 9–23)
BUN: 14 mg/dL (ref 6–20)
Bilirubin Total: 0.3 mg/dL (ref 0.0–1.2)
CO2: 25 mmol/L (ref 20–29)
Calcium: 9.6 mg/dL (ref 8.7–10.2)
Chloride: 98 mmol/L (ref 96–106)
Creatinine, Ser: 0.71 mg/dL (ref 0.57–1.00)
Globulin, Total: 2.5 g/dL (ref 1.5–4.5)
Glucose: 74 mg/dL (ref 70–99)
Potassium: 4.4 mmol/L (ref 3.5–5.2)
Sodium: 137 mmol/L (ref 134–144)
Total Protein: 6.9 g/dL (ref 6.0–8.5)
eGFR: 111 mL/min/1.73

## 2024-08-24 LAB — CBC
Hematocrit: 43 % (ref 34.0–46.6)
Hemoglobin: 14.3 g/dL (ref 11.1–15.9)
MCH: 31.4 pg (ref 26.6–33.0)
MCHC: 33.3 g/dL (ref 31.5–35.7)
MCV: 95 fL (ref 79–97)
Platelets: 325 x10E3/uL (ref 150–450)
RBC: 4.55 x10E6/uL (ref 3.77–5.28)
RDW: 13.2 % (ref 11.7–15.4)
WBC: 7.7 x10E3/uL (ref 3.4–10.8)

## 2024-08-24 LAB — LIPID PANEL
Chol/HDL Ratio: 3.1 ratio (ref 0.0–4.4)
Cholesterol, Total: 201 mg/dL — ABNORMAL HIGH (ref 100–199)
HDL: 65 mg/dL
LDL Chol Calc (NIH): 127 mg/dL — ABNORMAL HIGH (ref 0–99)
Triglycerides: 48 mg/dL (ref 0–149)
VLDL Cholesterol Cal: 9 mg/dL (ref 5–40)

## 2024-08-24 LAB — HEMOGLOBIN A1C
Est. average glucose Bld gHb Est-mCnc: 103 mg/dL
Hgb A1c MFr Bld: 5.2 % (ref 4.8–5.6)

## 2024-08-24 LAB — TSH: TSH: 6.75 u[IU]/mL — ABNORMAL HIGH (ref 0.450–4.500)

## 2024-09-02 ENCOUNTER — Ambulatory Visit: Payer: Self-pay | Admitting: Certified Nurse Midwife

## 2024-09-17 ENCOUNTER — Other Ambulatory Visit
Admission: RE | Admit: 2024-09-17 | Discharge: 2024-09-17 | Disposition: A | Payer: Self-pay | Source: Ambulatory Visit | Attending: Medical Genetics | Admitting: Medical Genetics

## 2024-09-17 ENCOUNTER — Other Ambulatory Visit: Payer: Self-pay | Admitting: Medical Genetics

## 2024-09-20 ENCOUNTER — Ambulatory Visit
Admission: RE | Admit: 2024-09-20 | Discharge: 2024-09-20 | Disposition: A | Discharge status: Home / Self Care | Source: Ambulatory Visit | Attending: Certified Nurse Midwife | Admitting: Certified Nurse Midwife

## 2024-09-20 ENCOUNTER — Encounter

## 2024-09-20 DIAGNOSIS — Z1231 Encounter for screening mammogram for malignant neoplasm of breast: Secondary | ICD-10-CM

## 2024-09-20 DIAGNOSIS — Z01419 Encounter for gynecological examination (general) (routine) without abnormal findings: Secondary | ICD-10-CM

## 2024-09-23 ENCOUNTER — Other Ambulatory Visit: Payer: Self-pay | Admitting: Certified Nurse Midwife

## 2024-09-23 DIAGNOSIS — R928 Other abnormal and inconclusive findings on diagnostic imaging of breast: Secondary | ICD-10-CM

## 2025-05-25 ENCOUNTER — Ambulatory Visit: Admitting: Physician Assistant
